# Patient Record
Sex: Female | Born: 1980 | Race: Black or African American | Hispanic: No | Marital: Married | Smoking: Never smoker
Health system: Southern US, Community
[De-identification: ages and names within clinical notes are randomized; demographics above are authoritative.]

## PROBLEM LIST (undated history)

## (undated) DIAGNOSIS — F32A Depression, unspecified: Secondary | ICD-10-CM

## (undated) DIAGNOSIS — F419 Anxiety disorder, unspecified: Secondary | ICD-10-CM

## (undated) DIAGNOSIS — Z789 Other specified health status: Secondary | ICD-10-CM

## (undated) DIAGNOSIS — F329 Major depressive disorder, single episode, unspecified: Secondary | ICD-10-CM

## (undated) HISTORY — PX: NO PAST SURGERIES: SHX2092

## (undated) HISTORY — PX: OTHER SURGICAL HISTORY: SHX169

## (undated) HISTORY — DX: Major depressive disorder, single episode, unspecified: F32.9

## (undated) HISTORY — DX: Anxiety disorder, unspecified: F41.9

## (undated) HISTORY — DX: Depression, unspecified: F32.A

---

## 2005-11-29 ENCOUNTER — Other Ambulatory Visit: Admission: RE | Admit: 2005-11-29 | Discharge: 2005-11-29 | Payer: Self-pay | Admitting: Cardiology

## 2006-05-07 ENCOUNTER — Encounter: Admission: RE | Admit: 2006-05-07 | Discharge: 2006-05-07 | Payer: Self-pay | Admitting: Internal Medicine

## 2006-12-16 ENCOUNTER — Other Ambulatory Visit: Admission: RE | Admit: 2006-12-16 | Discharge: 2006-12-16 | Payer: Self-pay | Admitting: Internal Medicine

## 2007-12-22 ENCOUNTER — Other Ambulatory Visit: Admission: RE | Admit: 2007-12-22 | Discharge: 2007-12-22 | Payer: Self-pay | Admitting: Internal Medicine

## 2008-02-15 IMAGING — US US ABDOMEN COMPLETE
1 series · 14 of 25 positions shown · non-contrast
Comparison: none

CLINICAL DATA: 24 year old female with epigastric abdominal pain.  
COMPLETE ABDOMINAL ULTRASOUND:
TECHNIQUE: Complete abdominal ultrasound examination was performed including evaluation of the liver, gallbladder, bile ducts, pancreas, kidneys, spleen, IVC, and abdominal aorta.

[Series 1: us abdomen complete · 0.34mm/px · 14 of 79 slices shown]
[im 1/79]
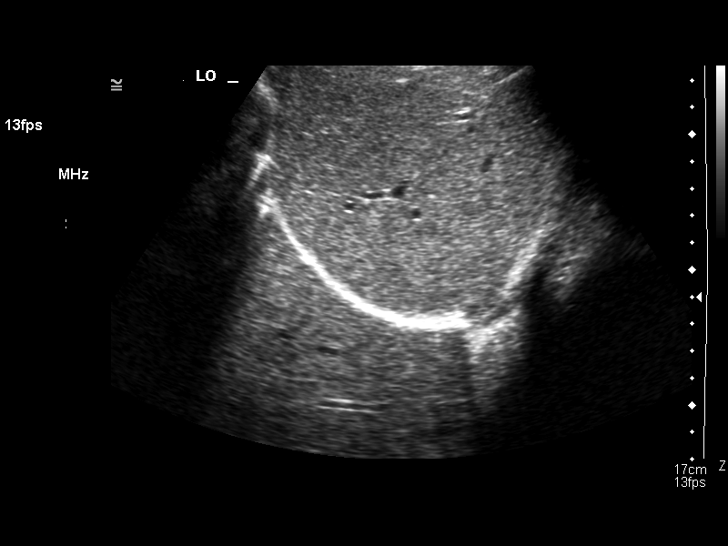
[im 7/79]
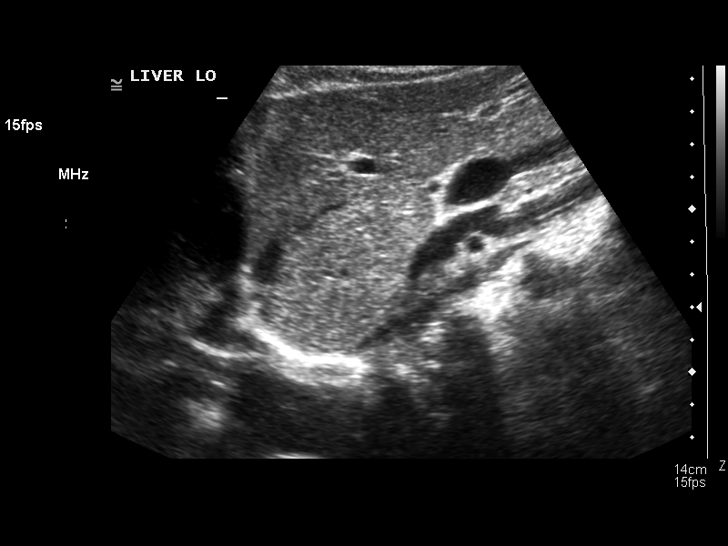
[im 14/79]
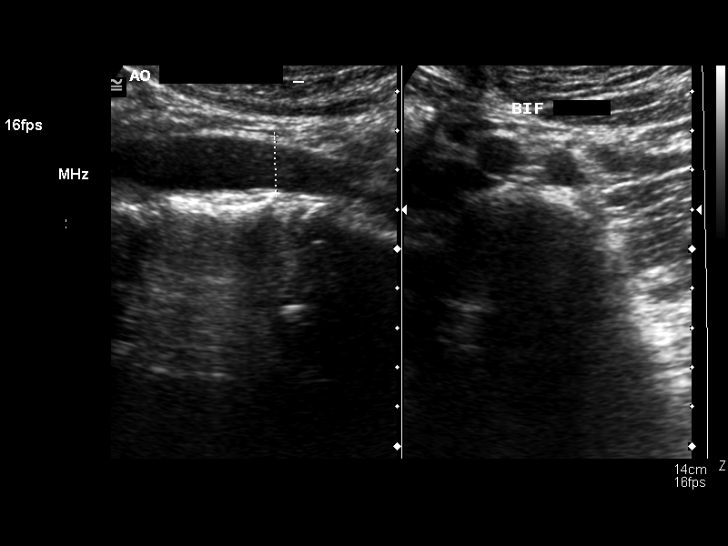
[im 20/79]
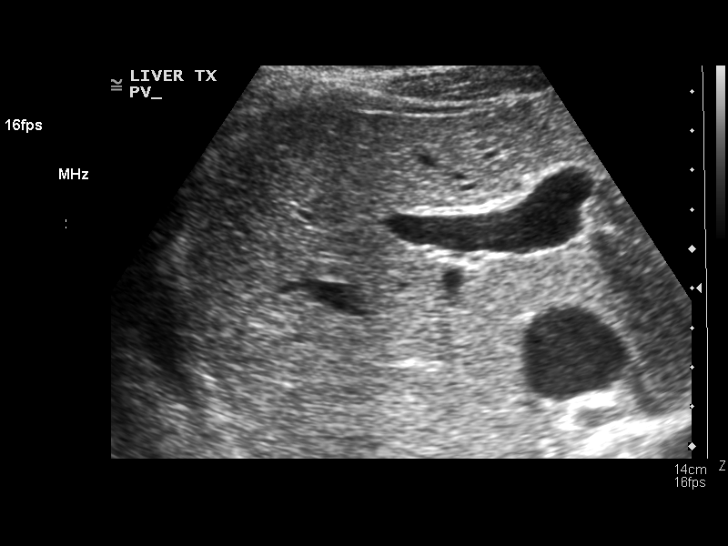
[im 27/79]
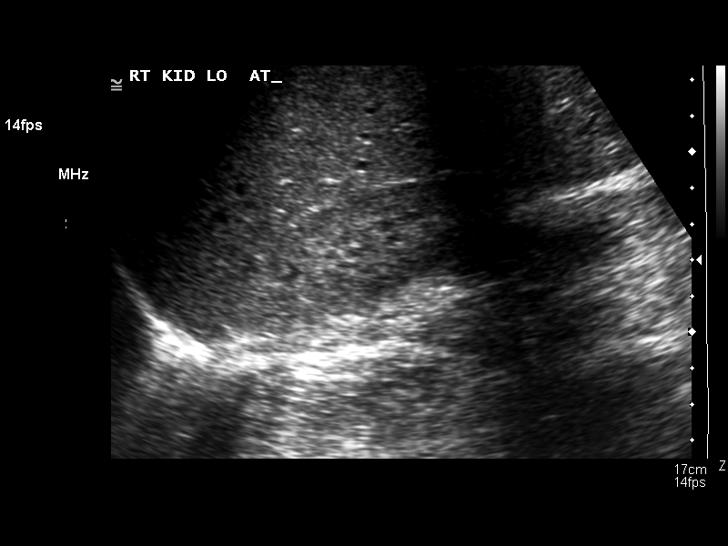
[im 30/79]
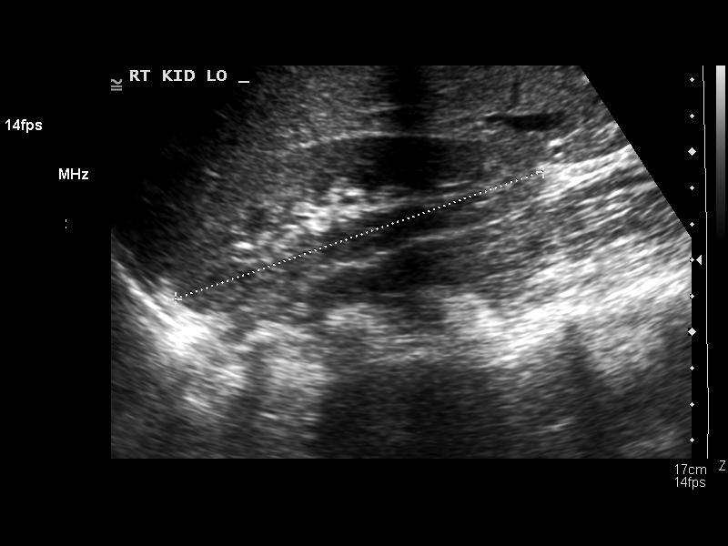
[im 36/79]
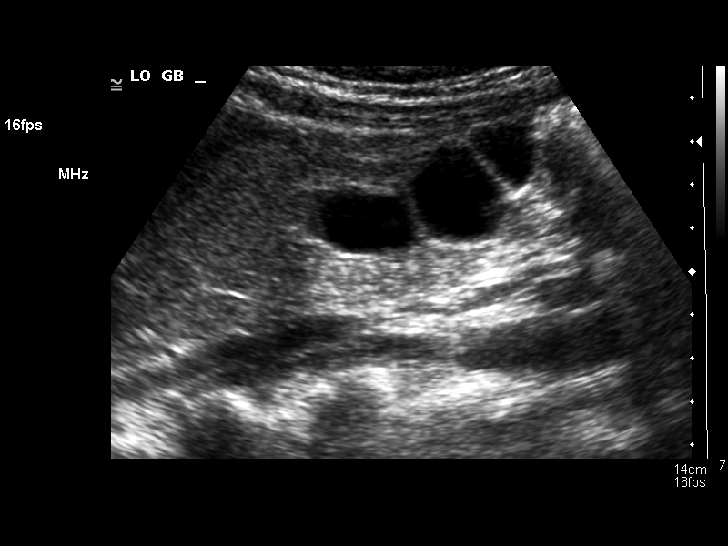
[im 43/79]
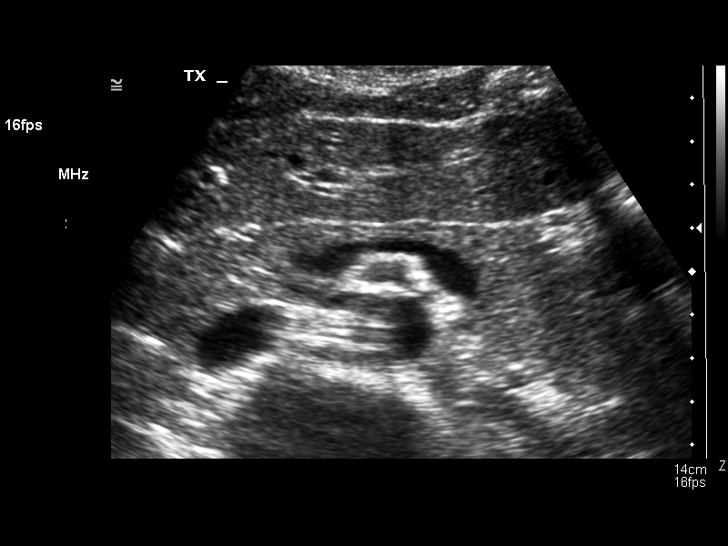
[im 49/79]
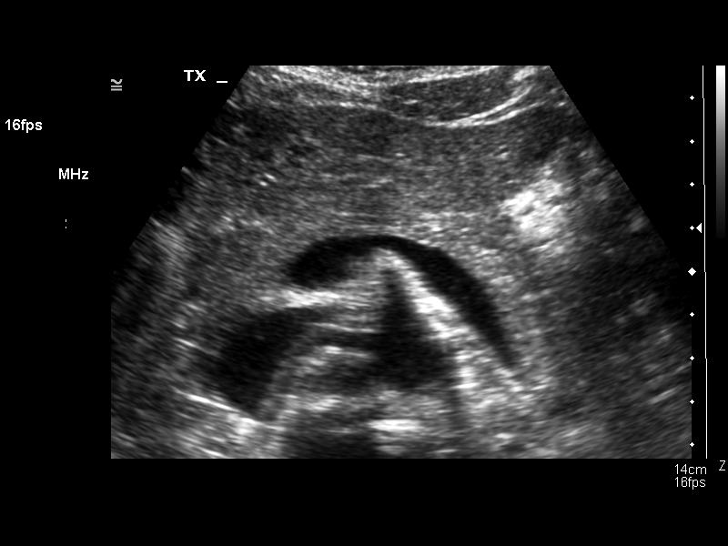
[im 53/79]
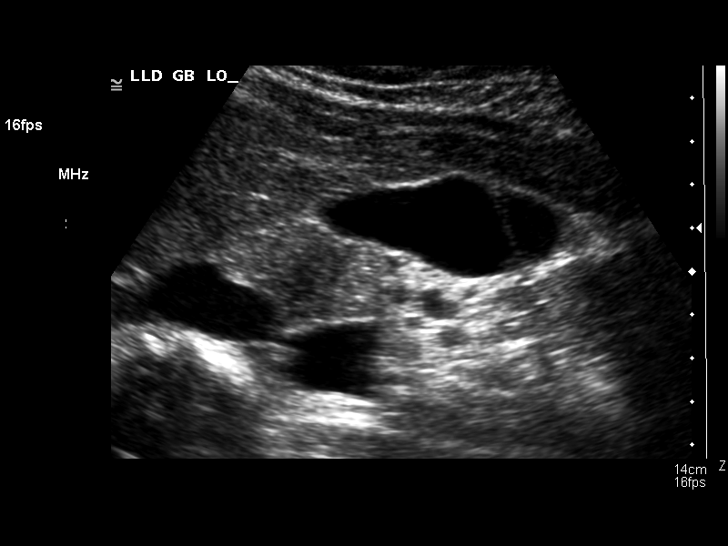
[im 59/79]
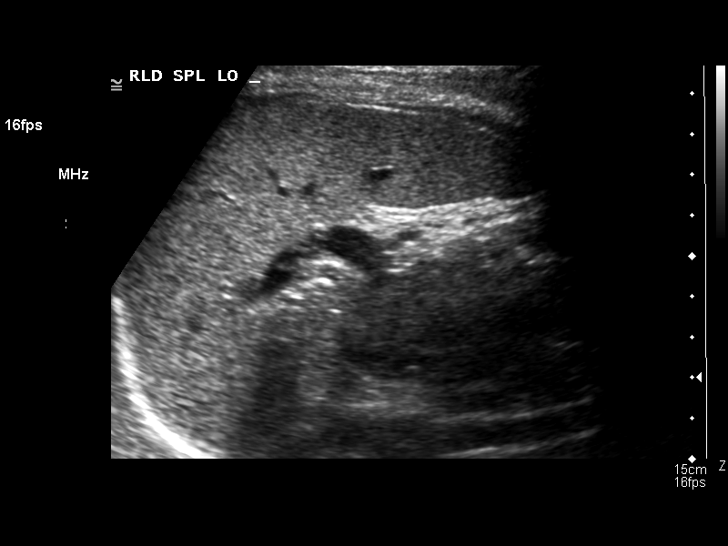
[im 66/79]
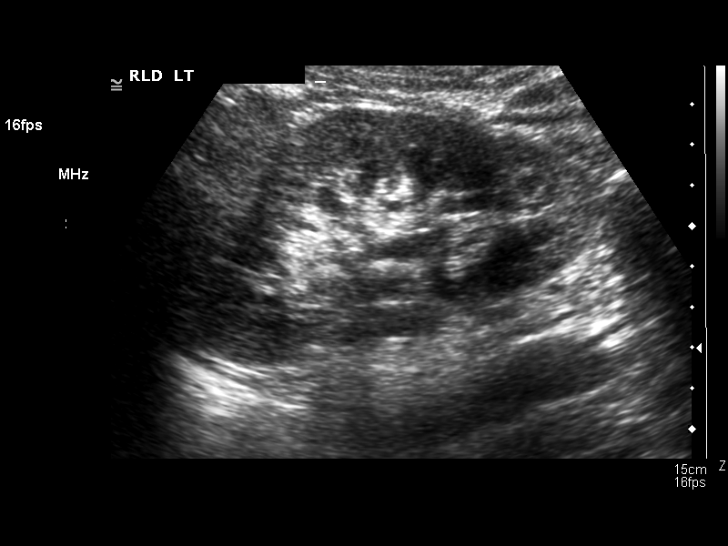
[im 72/79]
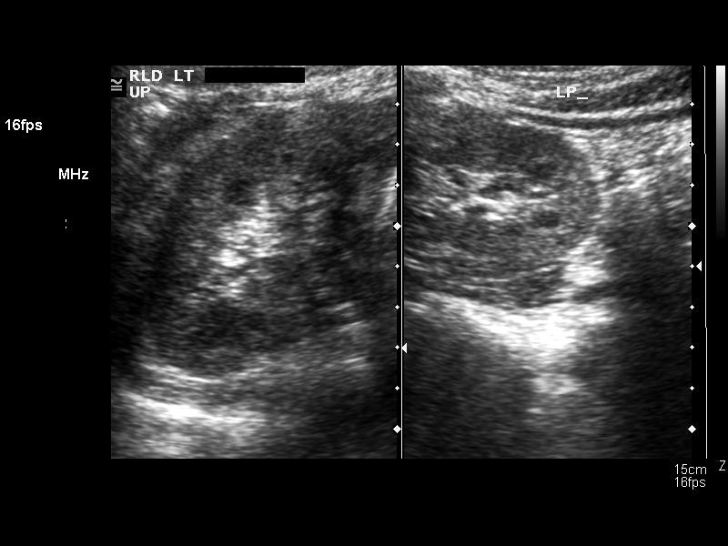
[im 79/79]
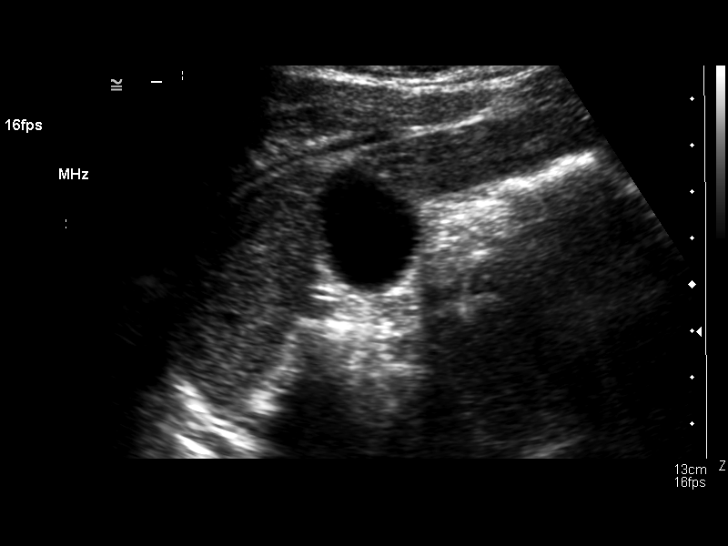

[14 of 25 positions shown; findings below may reference images not displayed]

FINDINGS: There is no evidence of gallstones or biliary ductal dilatation.  The liver is within normal limits in echogenicity, and no focal liver lesions are seen.  The visualized portions of the IVC and pancreas are unremarkable.
There is no evidence of splenomegaly.  The kidneys are unremarkable, and there is no evidence of hydronephrosis.  The abdominal aorta is non-dilated.
Measurements:  Normal gallbladder wall thickness at 1mm.  Common bile duct is non dilated with a diameter of 2mm.  The spleen has a length of 9.7cm.  Both kidneys measure 10.8cm.  The aorta has a maximal diameter of 2cm.  No ascites.
IMPRESSION: Negative abdominal ultrasound.

## 2008-12-30 ENCOUNTER — Other Ambulatory Visit: Admission: RE | Admit: 2008-12-30 | Discharge: 2008-12-30 | Payer: Self-pay | Admitting: Internal Medicine

## 2010-11-26 NOTE — L&D Delivery Note (Signed)
  Delivery Note  Complete dilation at 0803 Onset of pushing at 0803 FHR second stage 145 / cat 1  Anesthesia: none  Delivery of a viable female at [phone] CNM in LOA position.  Nuchal Cord none. Cord double clamped after cessation of pulsation, cut by FOB.  Cord blood sample collected. Collection of cord blood donation - not collected. Arterial cord blood sample not indicated.  Placenta delivered spontaneous by [facility] intact with 3 VC at 0857. Placenta to L&D. Uterine tone firm bleeding small  2nd laceration identified.  1% xylocaine local Repair 3-0 vicyrl for vaginal and interrupted followed by 4-0 subcuticular skin closure Minimal edema to tissue Est. Blood Loss (mL):  Complications: none  Mom to postpartum.  Baby to with Mom.  Cliff Damiani 08/05/2011, 9:25 AM

## 2011-01-08 LAB — HIV ANTIBODY (ROUTINE TESTING W REFLEX): HIV: NONREACTIVE

## 2011-01-08 LAB — RUBELLA ANTIBODY, IGM: Rubella: IMMUNE

## 2011-01-08 LAB — RPR: RPR: NONREACTIVE

## 2011-04-30 ENCOUNTER — Observation Stay (HOSPITAL_COMMUNITY)
Admission: AD | Admit: 2011-04-30 | Discharge: 2011-05-01 | DRG: 886 | Disposition: A | Discharge status: Home / Self Care | Payer: BC Managed Care – PPO | Source: Ambulatory Visit | Attending: Obstetrics & Gynecology | Admitting: Obstetrics & Gynecology

## 2011-04-30 ENCOUNTER — Inpatient Hospital Stay (HOSPITAL_COMMUNITY): Payer: BC Managed Care – PPO

## 2011-04-30 DIAGNOSIS — Z2989 Encounter for other specified prophylactic measures: Secondary | ICD-10-CM

## 2011-04-30 DIAGNOSIS — O99891 Other specified diseases and conditions complicating pregnancy: Principal | ICD-10-CM | POA: Diagnosis present

## 2011-04-30 DIAGNOSIS — O368390 Maternal care for abnormalities of the fetal heart rate or rhythm, unspecified trimester, not applicable or unspecified: Secondary | ICD-10-CM | POA: Diagnosis not present

## 2011-04-30 DIAGNOSIS — Z298 Encounter for other specified prophylactic measures: Secondary | ICD-10-CM

## 2011-04-30 DIAGNOSIS — Y92009 Unspecified place in unspecified non-institutional (private) residence as the place of occurrence of the external cause: Secondary | ICD-10-CM

## 2011-04-30 DIAGNOSIS — W108XXA Fall (on) (from) other stairs and steps, initial encounter: Secondary | ICD-10-CM | POA: Diagnosis present

## 2011-04-30 LAB — KLEIHAUER-BETKE STAIN: Quantitation Fetal Hemoglobin: 5 mL

## 2011-05-02 LAB — RH IMMUNE GLOBULIN WORKUP (NOT WOMEN'S HOSP): ABO/RH(D): A NEG

## 2011-06-01 NOTE — Discharge Summary (Signed)
  NAMEKARLE, DESROSIER NO.:  [id number]  MEDICAL RECORD NO.:  [id number]  LOCATION:  9163                          FACILITY:  [facility]  PHYSICIAN:  Genia Del, M.D.DATE OF BIRTH:  1981/03/14  DATE OF ADMISSION:  04/30/2011 DATE OF DISCHARGE:  05/01/2011                              DISCHARGE SUMMARY   ADMISSION DIAGNOSIS:  A 25 weeks and 1 day gestation intrauterine pregnancy status post fall at home.  The patient admitted for 24 observation.  DISCHARGE DIAGNOSIS:  A 25 weeks and 2 days' gestation, stable post fall with symptoms of abruption.  HISTORY:  The patient is gravida 1, para 0, at 25 weeks and 1 day gestation with [facility] of August 13, 2011.  Prenatal care was obtained at [facility] since 9 weeks with primary Dr. Ernestina Penna.  PRENATAL LABS:  A negative, rubella immune, HIV negative, RPR negative, hepatitis and B negative.  Prenatal course complicated by subchorionic hemorrhage at 14 weeks for which she received a dose of RhoGAM.  MEDICAL AND SURGICAL HISTORY:  PCOS and infertility.  ALLERGIES:  HYDROCODONE.  CURRENT MEDICATIONS:  Prenatal vitamins.  The patient presented to MAU after falling down 5-6 stairs at home on her back.  On admit, the patient had no vaginal bleeding, no leaking of fluid.  Good fetal movement.  Review of systems was negative.  Vital signs stable.  Fetal heart rate was in the 50s with accelerations and no decels.  No contractions on toco.  Admit labs, Kleihauer-Betke showed 0.1% equal to 5 mL of fetal hemoglobin in maternal circulation.  The patient received a dose of RhoGAM and underwent continuous electronic fetal monitoring.  She was observed overnight.  Normal physical exam.  Abdomen was nontender and no rigidity noted.  The next morning, fetal heart rate remained in the 150s with positive accelerations and no contractions. The patient was discharged home with abruption precautions, instructed on fetal movement count  daily.  She is to follow up with [facility] as scheduled in 1 week.   ______________________________ Arlan Organ, CNM   ______________________________ Genia Del, M.D.   DP/MEDQ  D:  05/02/2011  T:  05/03/2011  Job:  [id number]  Electronically Signed by Arlan Organ CNM on 05/06/2011 06:51:27 PM Electronically Signed by Genia Del M.D. on 06/01/2011 09:47:27 PM

## 2011-07-09 LAB — STREP B DNA PROBE: GBS: NEGATIVE

## 2011-08-05 ENCOUNTER — Inpatient Hospital Stay (HOSPITAL_COMMUNITY)
Admission: AD | Admit: 2011-08-05 | Discharge: 2011-08-07 | DRG: 372 | Disposition: A | Discharge status: Home / Self Care | Payer: BC Managed Care – PPO | Source: Ambulatory Visit | Attending: Obstetrics | Admitting: Obstetrics

## 2011-08-05 ENCOUNTER — Encounter (HOSPITAL_COMMUNITY): Payer: Self-pay | Admitting: *Deleted

## 2011-08-05 DIAGNOSIS — O4290 Premature rupture of membranes, unspecified as to length of time between rupture and onset of labor, unspecified weeks of gestation: Secondary | ICD-10-CM | POA: Diagnosis present

## 2011-08-05 DIAGNOSIS — IMO0001 Reserved for inherently not codable concepts without codable children: Secondary | ICD-10-CM | POA: Diagnosis present

## 2011-08-05 DIAGNOSIS — O139 Gestational [pregnancy-induced] hypertension without significant proteinuria, unspecified trimester: Principal | ICD-10-CM | POA: Diagnosis present

## 2011-08-05 HISTORY — DX: Other specified health status: Z78.9

## 2011-08-05 LAB — CBC
HCT: 38.7 % (ref 36.0–46.0)
Hemoglobin: 12.6 g/dL (ref 12.0–15.0)
MCH: 27.9 pg (ref 26.0–34.0)
MCHC: 32.6 g/dL (ref 30.0–36.0)
MCV: 85.6 fL (ref 78.0–100.0)
Platelets: 185 10*3/uL (ref 150–400)
RBC: 4.52 MIL/uL (ref 3.87–5.11)
RDW: 13.6 % (ref 11.5–15.5)
WBC: 10.5 10*3/uL (ref 4.0–10.5)

## 2011-08-05 LAB — COMPREHENSIVE METABOLIC PANEL
ALT: 22 U/L (ref 0–35)
AST: 28 U/L (ref 0–37)
Albumin: 2.9 g/dL — ABNORMAL LOW (ref 3.5–5.2)
Alkaline Phosphatase: 205 U/L — ABNORMAL HIGH (ref 39–117)
BUN: 7 mg/dL (ref 6–23)
CO2: 22 mEq/L (ref 19–32)
Calcium: 9.4 mg/dL (ref 8.4–10.5)
Chloride: 105 mEq/L (ref 96–112)
Creatinine, Ser: 0.53 mg/dL (ref 0.50–1.10)
GFR calc Af Amer: >60 mL/min (ref >60)
GFR calc non Af Amer: >60 mL/min (ref >60)
Glucose, Bld: 84 mg/dL (ref 70–99)
Potassium: 3.8 mEq/L (ref 3.5–5.1)
Sodium: 136 mEq/L (ref 135–145)
Total Bilirubin: 0.5 mg/dL (ref 0.3–1.2)
Total Protein: 6.3 g/dL (ref 6.0–8.3)

## 2011-08-05 LAB — GC/CHLAMYDIA PROBE AMP, GENITAL: Chlamydia: NEGATIVE

## 2011-08-05 LAB — RUBELLA ANTIBODY, IGM: Rubella: IMMUNE

## 2011-08-05 LAB — STREP B DNA PROBE: GBS: NEGATIVE

## 2011-08-05 LAB — HIV ANTIBODY (ROUTINE TESTING W REFLEX): HIV: NONREACTIVE

## 2011-08-05 LAB — LACTATE DEHYDROGENASE: LDH: 217 U/L (ref 94–250)

## 2011-08-05 LAB — URIC ACID: Uric Acid, Serum: 4.7 mg/dL (ref 2.4–7.0)

## 2011-08-05 LAB — RPR: RPR Ser Ql: NONREACTIVE

## 2011-08-05 MED ORDER — FLEET ENEMA 7-19 GM/118ML RE ENEM: 1.0000 | ENEMA | RECTAL | Status: DC | PRN

## 2011-08-05 MED ORDER — LACTATED RINGERS IV SOLN: 500.0000 mL | INTRAVENOUS | Status: DC | PRN

## 2011-08-05 MED ORDER — BENZOCAINE-MENTHOL 20-0.5 % EX AERO
INHALATION_SPRAY | CUTANEOUS | Status: AC
  Administered 2011-08-05: 17:00:00
  Filled 2011-08-05: qty 56

## 2011-08-05 MED ORDER — IBUPROFEN 600 MG PO TABS: 600.0000 mg | ORAL_TABLET | Freq: Four times a day (QID) | ORAL | Status: DC | PRN

## 2011-08-05 MED ORDER — ACETAMINOPHEN 325 MG PO TABS: 650.0000 mg | ORAL_TABLET | ORAL | Status: DC | PRN

## 2011-08-05 MED ORDER — LIDOCAINE HCL (PF) 1 % IJ SOLN
30.0000 mL | INTRAMUSCULAR | Status: DC | PRN
  Filled 2011-08-05 (×2): qty 30

## 2011-08-05 MED ORDER — LACTATED RINGERS IV SOLN
500.0000 mL | INTRAVENOUS | Status: DC | PRN
  Administered 2011-08-05: 1000 mL via INTRAVENOUS

## 2011-08-05 MED ORDER — NALBUPHINE HCL 10 MG/ML IJ SOLN: 10.0000 mg | INTRAMUSCULAR | Status: DC | PRN

## 2011-08-05 MED ORDER — TETANUS-DIPHTH-ACELL PERTUSSIS 5-2.5-18.5 LF-MCG/0.5 IM SUSP
0.5000 mL | Freq: Once | INTRAMUSCULAR | Status: AC
  Administered 2011-08-06: 0.5 mL via INTRAMUSCULAR
  Filled 2011-08-05: qty 0.5

## 2011-08-05 MED ORDER — IBUPROFEN 600 MG PO TABS
600.0000 mg | ORAL_TABLET | Freq: Four times a day (QID) | ORAL | Status: DC | PRN
  Administered 2011-08-05 – 2011-08-07 (×7): 600 mg via ORAL
  Filled 2011-08-05 (×7): qty 1

## 2011-08-05 MED ORDER — OXYTOCIN BOLUS FROM INFUSION
500.0000 mL | Freq: Once | INTRAVENOUS | Status: DC
  Filled 2011-08-05: qty 500

## 2011-08-05 MED ORDER — LACTATED RINGERS IV SOLN
INTRAVENOUS | Status: DC
  Administered 2011-08-05: 02:00:00 via INTRAVENOUS

## 2011-08-05 MED ORDER — OXYTOCIN 20 UNITS IN LACTATED RINGERS INFUSION - SIMPLE
1.0000 m[IU]/min | INTRAVENOUS | Status: DC
  Administered 2011-08-05: 4 m[IU]/min via INTRAVENOUS
  Administered 2011-08-05: 999 m[IU]/min via INTRAVENOUS
  Administered 2011-08-05: 2 m[IU]/min via INTRAVENOUS
  Filled 2011-08-05: qty 1000

## 2011-08-05 MED ORDER — SIMETHICONE 80 MG PO CHEW: 80.0000 mg | CHEWABLE_TABLET | ORAL | Status: DC | PRN

## 2011-08-05 MED ORDER — SENNOSIDES-DOCUSATE SODIUM 8.6-50 MG PO TABS
2.0000 | ORAL_TABLET | Freq: Every day | ORAL | Status: DC
  Administered 2011-08-05 – 2011-08-06 (×2): 2 via ORAL

## 2011-08-05 MED ORDER — CITRIC ACID-SODIUM CITRATE 334-500 MG/5ML PO SOLN: 30.0000 mL | ORAL | Status: DC | PRN

## 2011-08-05 MED ORDER — ONDANSETRON HCL 4 MG/2ML IJ SOLN: 4.0000 mg | Freq: Four times a day (QID) | INTRAMUSCULAR | Status: DC | PRN

## 2011-08-05 MED ORDER — IBUPROFEN 800 MG PO TABS
800.0000 mg | ORAL_TABLET | Freq: Once | ORAL | Status: DC
  Filled 2011-08-05: qty 1

## 2011-08-05 MED ORDER — LIDOCAINE HCL 2 % EX GEL
Freq: Once | CUTANEOUS | Status: DC
  Filled 2011-08-05: qty 5

## 2011-08-05 MED ORDER — LIDOCAINE HCL (PF) 1 % IJ SOLN: 30.0000 mL | INTRAMUSCULAR | Status: DC | PRN

## 2011-08-05 MED ORDER — TERBUTALINE SULFATE 1 MG/ML IJ SOLN: 0.2500 mg | Freq: Once | INTRAMUSCULAR | Status: DC | PRN

## 2011-08-05 MED ORDER — NALBUPHINE SYRINGE 5 MG/0.5 ML
10.0000 mg | INJECTION | Freq: Once | INTRAMUSCULAR | Status: AC
  Administered 2011-08-05: 10 mg via INTRAVENOUS
  Filled 2011-08-05: qty 1

## 2011-08-05 MED ORDER — OXYTOCIN 20 UNITS IN LACTATED RINGERS INFUSION - SIMPLE: 125.0000 mL/h | Freq: Once | INTRAVENOUS | Status: DC

## 2011-08-05 MED ORDER — TRAMADOL-ACETAMINOPHEN 37.5-325 MG PO TABS
1.0000 | ORAL_TABLET | Freq: Four times a day (QID) | ORAL | Status: DC | PRN
  Administered 2011-08-05: 1 via ORAL
  Filled 2011-08-05: qty 1

## 2011-08-05 MED ORDER — IBUPROFEN 800 MG PO TABS
800.0000 mg | ORAL_TABLET | Freq: Three times a day (TID) | ORAL | Status: DC
  Administered 2011-08-05: 800 mg via ORAL
  Filled 2011-08-05: qty 1

## 2011-08-05 MED ORDER — BENZOCAINE-MENTHOL 20-0.5 % EX AERO: 1.0000 "application " | INHALATION_SPRAY | CUTANEOUS | Status: DC | PRN

## 2011-08-05 MED ORDER — LANOLIN HYDROUS EX OINT: TOPICAL_OINTMENT | CUTANEOUS | Status: DC | PRN

## 2011-08-05 MED ORDER — FAMOTIDINE 20 MG PO TABS
20.0000 mg | ORAL_TABLET | Freq: Every day | ORAL | Status: DC
  Administered 2011-08-06 – 2011-08-07 (×2): 20 mg via ORAL
  Filled 2011-08-05 (×2): qty 1

## 2011-08-05 MED ORDER — PRENATAL PLUS 27-1 MG PO TABS
1.0000 | ORAL_TABLET | Freq: Every day | ORAL | Status: DC
  Administered 2011-08-06 – 2011-08-07 (×2): 1 via ORAL
  Filled 2011-08-05 (×2): qty 1

## 2011-08-05 NOTE — Progress Notes (Signed)
1115 Pt transferred to 118 per w/c with baby

## 2011-08-05 NOTE — Progress Notes (Signed)
New order received for Nubain 10mg  IV once by Marlinda Mike, CNM

## 2011-08-05 NOTE — Progress Notes (Signed)
G1 at 38.1wks. Leaking watery, pinkish fld since 1800. Mild cramping

## 2011-08-05 NOTE — H&P (Signed)
  Lauren Mcdonald is a 30 y.o. female presenting for SROM since 1700 on 08/04/2011 - few mild ctx but not actively laboring. PNCare at [facility] since 9 wks with Dr Ernestina Penna as primary OB. Elevated blood pressure today - first trimester BP 130/80 OB History    Grav Para Term Preterm Abortions TAB SAB Ect Mult Living   1              Past Medical History  Diagnosis Date  . No pertinent past medical history    Past Surgical History  Procedure Date  . No past surgeries    Family History: family history is not on file. Social History:  reports that she has never smoked. She does not have any smokeless tobacco history on file. She reports that she does not drink alcohol or use illicit drugs.  Review of Systems SROM at 1700 - clear fluid with pink pinged color after midnight. Few mild ctx - not painful at all. Planning epidural in active labor.  + FM. No bleeding. No headache, vision changes, or epigastric pain. No hx hypertension in past or with antepartum visits.  Physical Exam Alert and oriented, mildly anxious about admission Heart: RRR Lungs: unlabored / no SOB / clear Abdomen: soft / gravid at term Extremities: trace edema / no calf pain  Cervix: ( not re-examined - just examined by nurse prior to starting pitocin) Dilation: 2 Effacement (%): 50 Station: -2 Exam by:: A.Davis, RN  Vital Signs: Blood pressure 134/95, pulse 70, temperature 98.1 F (36.7 C), temperature source Oral, resp. rate 17, height 5\' 2"  (1.575 m), weight 87.635 kg (193 lb 3.2 oz), last menstrual period 11/04/2010.   Admission Labs:  08/05/2011 03:16  CBC 08/05/2011 01:56  WBC 10.5  RBC 4.52  HGB 12.6  HCT 38.7  MCV 85.6  MCH 27.9  MCHC 32.6  RDW 13.6  Platelets 185   PIH labs reviewed: Creatinine 0.53, Uric acid 4.7, SGOT 28, SGPT22   Prenatal labs: ABO, Rh: --/--/A NEG (06/04 2338) Antibody: POS (06/04 2338) Rubella:  Immune RPR: Nonreactive (09/09 0000)  HBsAg: Negative (09/09 0000)    HIV: Non-reactive (09/09 0000)  GBS: Negative (09/09 0000)  1 hr Glucola - NL  Anatomy US - NL   Assessment/Plan: 38 weeks SROM - premature prior to onset of labor Negative GBS Elevation of BP - consistent with gestational hypertension  No evidence of pre-eclampsia at this time  1) Admit 2) Start low dose pitocin - labor augmentation ( ROM x 7 hours)  BAILEY,TANYA 08/05/2011, 3:10 AM

## 2011-08-05 NOTE — Progress Notes (Signed)
Nsy called to room Infant due to infant being cold and jettery

## 2011-08-05 NOTE — Progress Notes (Signed)
5621 spontaneous vaginal delivery of viable female apgars 8 and 9 see delivery record

## 2011-08-05 NOTE — H&P (Signed)
Agree with note and plan.

## 2011-08-05 NOTE — Progress Notes (Signed)
Wiliam Ke CNM and Sela Hua CNM at bedside

## 2011-08-05 NOTE — Progress Notes (Signed)
  S: Feeling urge to push      Tolerating contractions well- received one dose nubain 10 mg - planned epidural but decided not to proceed with epidural due to rapid labor progression and urge to push   O:  VS: Blood pressure 151/80, pulse 65, temperature 97.4 F (36.3 C), temperature source Oral, resp. rate 20, height 5\' 2"  (1.575 m), weight 87.635 kg (193 lb 3.2 oz), last menstrual period 11/04/2010.        FHR : baseline 145 / variability moderaet / accels 10x10 / decels few rare variables to 110        EFM: Category 1        Toco: contractions every 2 minutes / pitocin at 8 mu/min - decreased to 4 mu/min with complete dilation due to intense pain with ctx and decision not to get epidural        Cervix : 10 cm / 100 % / vtx / +        Membranes: clear with show  A: active labor     Complete dilation with urge to push  P: begin active second stage     Anticipate SVB     BAILEY,TANYA 08/05/2011, 8:16 AM

## 2011-08-06 LAB — CBC
HCT: 32 % — ABNORMAL LOW (ref 36.0–46.0)
Hemoglobin: 10.6 g/dL — ABNORMAL LOW (ref 12.0–15.0)
MCH: 28.2 pg (ref 26.0–34.0)
MCHC: 33.1 g/dL (ref 30.0–36.0)
MCV: 85.1 fL (ref 78.0–100.0)
Platelets: 162 10*3/uL (ref 150–400)
RBC: 3.76 MIL/uL — ABNORMAL LOW (ref 3.87–5.11)
RDW: 13.5 % (ref 11.5–15.5)
WBC: 13.1 10*3/uL — ABNORMAL HIGH (ref 4.0–10.5)

## 2011-08-06 NOTE — Progress Notes (Signed)
  Post Partum Day #1            Subjective:   Pt reports feeling well, (+) sacral/ coccyx pain / Tolerating po/ Voiding without problems/ No n/v/ Bleeding is light/ Pain controlled withprescription NSAID's including Motrin and tramadol  Newborn info:  Information for the patient's newborn:  Josphine, Laffey [[id number]]  female feeding breast          Objective:     VS: Blood pressure 117/75, pulse 68, temperature 98.7 F (37.1 C), temperature source Oral, resp. rate 18, height 5\' 2"  (1.575 m), weight 87.635 kg (193 lb 3.2 oz), last menstrual period 11/04/2010, currently breastfeeding.   Intake/Output Summary (Last 24 hours) at 08/06/11 0859 Last data filed at 08/05/11 0865  Gross per 24 hour  Intake      0 ml  Output    300 ml  Net   -300 ml        Basename 08/06/11 0515 08/05/11 0156  WBC 13.1* 10.5  HGB 10.6* 12.6  HCT 32.0* 38.7  PLT 162 185     Blood type: A NEG (09/10 0515) / Infant Rh positive  Rubella: Immune (09/09 0000)     Physical Exam:   A & O x 3 NAD   Lungs: CTAB  Heart: RR  Abdomen: soft, non-tender, FF @ U+2 (full bladder)  Perineum: repair intact, edema none  Lochia: small   Extremities: neg Homans', edema trace    Assessment/Plan:  PPD # 1 / 30 y.o., G1P1001 S/P:spontaneous vaginal Active Problems:  Postpartum care following vaginal delivery (9/9) Gest. GTN at admit with nl PIH labs Rh neg mom with Rh pos infant  Rhogam prior to d/c    normal postpartum exam, No evidence of PEC  Doing well  Continue routine post partum orders  anticipate D/C in AM         LOS: 1 day   Malikye Reppond, CNM, MSN 08/06/2011, 8:59 AM

## 2011-08-07 LAB — RH IG WORKUP (INCLUDES ABO/RH)

## 2011-08-07 MED ORDER — INFLUENZA VIRUS VACC SPLIT PF IM SUSP
0.5000 mL | Freq: Once | INTRAMUSCULAR | Status: AC
  Administered 2011-08-07: 0.5 mL via INTRAMUSCULAR
  Filled 2011-08-07: qty 0.5

## 2011-08-07 MED ORDER — IBUPROFEN 600 MG PO TABS: 600.0000 mg | ORAL_TABLET | Freq: Four times a day (QID) | ORAL | Status: AC | PRN

## 2011-08-07 NOTE — Discharge Summary (Signed)
Obstetric Discharge Summary Reason for Admission: rupture of membranes Prenatal Procedures: ultrasound Intrapartum Procedures: spontaneous vaginal delivery Postpartum Procedures: Rho(D) Ig Complications-Operative and Postpartum: 2nd degree perineal laceration Hemoglobin  Date Value Range Status  08/06/2011 10.6* 12.0-15.0 (g/dL) Final     HCT  Date Value Range Status  08/06/2011 32.0* 36.0-46.0 (%) Final    Discharge Diagnoses: Term Pregnancy-delivered  Discharge Information: Date: 08/07/2011 Activity: pelvic rest Diet: routine Medications: PNV and Ibuprophen Condition: stable Instructions: refer to practice specific booklet Discharge to: home Follow-up Information    Follow up with [facility] in 6 weeks.   Contact information:   [address] [place] [address] [phone]          Newborn Data: Live born female  Birth Weight: 6 lb 8.6 oz (2965 g) APGAR: 8, 10  Home with mother.  [name] 08/07/2011, 8:26 AM

## 2011-08-07 NOTE — Consult Note (Signed)
Mom's breasts filling with hard, warm areas.  Reviewed differences between full breasts, engorgement and mastitis and treatment for each.  Lots of teaching done.  Nipples sore, but not broken area noted.  Comfort gels given for soreness with instructions

## 2011-08-07 NOTE — Progress Notes (Signed)
Post Partum Day #2           Information for the patient's newborn:  Keviana, Guida [[id number]]  female    Subjective: No HA, SOB, CP, F/C, breast symptoms. Coccyx Pain improved. Normal vaginal bleeding, no clots.  Rhogam given.  Feeding: breast  Objective: BP 128/83  Pulse 70  Temp(Src) 98.3 F (36.8 C) (Oral)  Resp 20  Ht 5\' 2"  (1.575 m)  Wt 87.635 kg (193 lb 3.2 oz)  BMI 35.34 kg/m2  LMP 11/04/2010  Breastfeeding? Unknown  No intake or output data in the 24 hours ending 08/07/11 0821     Basename 08/06/11 0515 08/05/11 0156  WBC 13.1* 10.5  HGB 10.6* 12.6  HCT 32.0* 38.7  PLT 162 185    Blood type: --/--/A NEG (09/10 0515) Rubella: Immune (09/09 0000)    Physical Exam:  General: alert, cooperative and no distress Uterine Fundus: firm Lochia: appropriate Perineum: repair intact, edema none DVT Evaluation: Negative Homan's sign. No significant calf/ankle edema.    Assessment/Plan: PPD # 2 / 30 y.o., G1P1001 S/P:spontaneous vaginal Principal Problem:  *Postpartum care following vaginal delivery (9/9)   normal postpartum exam  Continue current postpartum care  D/C home   LOS: 2 days   PAUL,DANIELA, CNM, MSN 08/07/2011, 8:21 AM

## 2011-12-22 ENCOUNTER — Ambulatory Visit (INDEPENDENT_AMBULATORY_CARE_PROVIDER_SITE_OTHER): Payer: BC Managed Care – PPO

## 2011-12-22 DIAGNOSIS — T2111XA Burn of first degree of chest wall, initial encounter: Secondary | ICD-10-CM

## 2011-12-22 DIAGNOSIS — M25569 Pain in unspecified knee: Secondary | ICD-10-CM

## 2011-12-26 ENCOUNTER — Encounter (HOSPITAL_BASED_OUTPATIENT_CLINIC_OR_DEPARTMENT_OTHER): Payer: Self-pay | Admitting: *Deleted

## 2011-12-26 NOTE — Progress Notes (Signed)
Instructed her to stop taking advil and take tylenol for pain between  now and her surgery.

## 2011-12-28 ENCOUNTER — Ambulatory Visit (HOSPITAL_BASED_OUTPATIENT_CLINIC_OR_DEPARTMENT_OTHER): Payer: BC Managed Care – PPO | Admitting: *Deleted

## 2011-12-28 ENCOUNTER — Encounter (HOSPITAL_BASED_OUTPATIENT_CLINIC_OR_DEPARTMENT_OTHER)
Admission: RE | Disposition: A | Discharge status: Home / Self Care | Payer: Self-pay | Source: Ambulatory Visit | Attending: Orthopedic Surgery

## 2011-12-28 ENCOUNTER — Encounter (HOSPITAL_BASED_OUTPATIENT_CLINIC_OR_DEPARTMENT_OTHER): Payer: Self-pay | Admitting: *Deleted

## 2011-12-28 ENCOUNTER — Encounter (HOSPITAL_BASED_OUTPATIENT_CLINIC_OR_DEPARTMENT_OTHER): Payer: Self-pay | Admitting: Anesthesiology

## 2011-12-28 ENCOUNTER — Ambulatory Visit (HOSPITAL_BASED_OUTPATIENT_CLINIC_OR_DEPARTMENT_OTHER)
Admission: RE | Admit: 2011-12-28 | Discharge: 2011-12-28 | Disposition: A | Discharge status: Home / Self Care | Payer: BC Managed Care – PPO | Source: Ambulatory Visit | Attending: Orthopedic Surgery | Admitting: Orthopedic Surgery

## 2011-12-28 DIAGNOSIS — M2240 Chondromalacia patellae, unspecified knee: Secondary | ICD-10-CM | POA: Insufficient documentation

## 2011-12-28 DIAGNOSIS — M659 Unspecified synovitis and tenosynovitis, unspecified site: Secondary | ICD-10-CM | POA: Insufficient documentation

## 2011-12-28 HISTORY — PX: KNEE ARTHROSCOPY: SHX127

## 2011-12-28 LAB — POCT HEMOGLOBIN-HEMACUE: Hemoglobin: 11.6 g/dL — ABNORMAL LOW (ref 12.0–15.0)

## 2011-12-28 SURGERY — ARTHROSCOPY, KNEE
Anesthesia: General | Site: Knee | Laterality: Left | Wound class: Clean

## 2011-12-28 MED ORDER — BUPIVACAINE HCL (PF) 0.25 % IJ SOLN
INTRAMUSCULAR | Status: DC | PRN
  Administered 2011-12-28: 20 mL

## 2011-12-28 MED ORDER — PROPOFOL 10 MG/ML IV EMUL
INTRAVENOUS | Status: DC | PRN
  Administered 2011-12-28: 250 mg via INTRAVENOUS

## 2011-12-28 MED ORDER — ONDANSETRON HCL 4 MG/2ML IJ SOLN
INTRAMUSCULAR | Status: DC | PRN
  Administered 2011-12-28: 4 mg via INTRAVENOUS

## 2011-12-28 MED ORDER — SODIUM CHLORIDE 0.9 % IR SOLN
Status: DC | PRN
  Administered 2011-12-28: 3000 mL

## 2011-12-28 MED ORDER — PROMETHAZINE HCL 25 MG/ML IJ SOLN: 6.2500 mg | INTRAMUSCULAR | Status: DC | PRN

## 2011-12-28 MED ORDER — KETOROLAC TROMETHAMINE 30 MG/ML IJ SOLN
INTRAMUSCULAR | Status: DC | PRN
  Administered 2011-12-28: 30 mg via INTRAVENOUS

## 2011-12-28 MED ORDER — PROPOFOL 10 MG/ML IV EMUL
INTRAVENOUS | Status: DC | PRN
  Administered 2011-12-28: 100 ug/kg/min via INTRAVENOUS

## 2011-12-28 MED ORDER — EPINEPHRINE HCL 1 MG/ML IJ SOLN
INTRAMUSCULAR | Status: DC | PRN
  Administered 2011-12-28: 1 mg

## 2011-12-28 MED ORDER — LACTATED RINGERS IV SOLN
INTRAVENOUS | Status: DC
  Administered 2011-12-28 (×2): via INTRAVENOUS

## 2011-12-28 MED ORDER — CEFAZOLIN SODIUM 1-5 GM-% IV SOLN
INTRAVENOUS | Status: DC | PRN
  Administered 2011-12-28: 1 g via INTRAVENOUS

## 2011-12-28 MED ORDER — ACETAMINOPHEN 10 MG/ML IV SOLN
1000.0000 mg | Freq: Once | INTRAVENOUS | Status: AC
  Administered 2011-12-28: 1000 mg via INTRAVENOUS

## 2011-12-28 MED ORDER — OXYCODONE-ACETAMINOPHEN 5-325 MG PO TABS: 1.0000 | ORAL_TABLET | Freq: Four times a day (QID) | ORAL | Status: AC | PRN

## 2011-12-28 MED ORDER — DEXAMETHASONE SODIUM PHOSPHATE 4 MG/ML IJ SOLN
INTRAMUSCULAR | Status: DC | PRN
  Administered 2011-12-28: 8 mg via INTRAVENOUS

## 2011-12-28 MED ORDER — FENTANYL CITRATE 0.05 MG/ML IJ SOLN
25.0000 ug | INTRAMUSCULAR | Status: DC | PRN
  Administered 2011-12-28 (×2): 25 ug via INTRAVENOUS

## 2011-12-28 SURGICAL SUPPLY — 41 items
BANDAGE ELASTIC 6 VELCRO ST LF (GAUZE/BANDAGES/DRESSINGS) ×2 IMPLANT
BLADE 4.2CUDA (BLADE) IMPLANT
BLADE GREAT WHITE 4.2 (BLADE) ×2 IMPLANT
CANISTER OMNI JUG 16 LITER (MISCELLANEOUS) ×1 IMPLANT
CANISTER SUCTION 2500CC (MISCELLANEOUS) IMPLANT
CLOTH BEACON ORANGE TIMEOUT ST (SAFETY) ×2 IMPLANT
CUTTER MENISCUS  4.2MM (BLADE)
CUTTER MENISCUS 4.2MM (BLADE) IMPLANT
DRAPE ARTHROSCOPY W/POUCH 114 (DRAPES) ×2 IMPLANT
DRSG EMULSION OIL 3X3 NADH (GAUZE/BANDAGES/DRESSINGS) ×2 IMPLANT
DURAPREP 26ML APPLICATOR (WOUND CARE) ×2 IMPLANT
ELECT MENISCUS 165MM 90D (ELECTRODE) IMPLANT
ELECT REM PT RETURN 9FT ADLT (ELECTROSURGICAL)
ELECTRODE REM PT RTRN 9FT ADLT (ELECTROSURGICAL) IMPLANT
GAUZE SPONGE 4X4 12PLY STRL LF (GAUZE/BANDAGES/DRESSINGS) ×1 IMPLANT
GLOVE BIO SURGEON STRL SZ 6.5 (GLOVE) ×1 IMPLANT
GLOVE BIOGEL PI IND STRL 8 (GLOVE) ×2 IMPLANT
GLOVE BIOGEL PI INDICATOR 8 (GLOVE) ×2
GLOVE ECLIPSE 7.5 STRL STRAW (GLOVE) ×4 IMPLANT
GLOVE INDICATOR 7.0 STRL GRN (GLOVE) ×1 IMPLANT
GOWN BRE IMP PREV XXLGXLNG (GOWN DISPOSABLE) ×2 IMPLANT
GOWN PREVENTION PLUS XLARGE (GOWN DISPOSABLE) ×2 IMPLANT
GOWN PREVENTION PLUS XXLARGE (GOWN DISPOSABLE) ×2 IMPLANT
HOLDER KNEE FOAM BLUE (MISCELLANEOUS) ×2 IMPLANT
KNEE WRAP E Z 3 GEL PACK (MISCELLANEOUS) ×2 IMPLANT
NDL SAFETY ECLIPSE 18X1.5 (NEEDLE) IMPLANT
NEEDLE HYPO 18GX1.5 SHARP (NEEDLE)
PACK ARTHROSCOPY DSU (CUSTOM PROCEDURE TRAY) ×2 IMPLANT
PACK BASIN DAY SURGERY FS (CUSTOM PROCEDURE TRAY) ×2 IMPLANT
PAD CAST 4YDX4 CTTN HI CHSV (CAST SUPPLIES) ×1 IMPLANT
PADDING CAST COTTON 4X4 STRL (CAST SUPPLIES) ×2
PADDING WEBRIL 4 STERILE (GAUZE/BANDAGES/DRESSINGS) ×1 IMPLANT
PENCIL BUTTON HOLSTER BLD 10FT (ELECTRODE) IMPLANT
SET ARTHROSCOPY TUBING (MISCELLANEOUS) ×2
SET ARTHROSCOPY TUBING LN (MISCELLANEOUS) ×1 IMPLANT
SPONGE GAUZE 4X4 12PLY (GAUZE/BANDAGES/DRESSINGS) ×2 IMPLANT
SUT ETHILON 4 0 PS 2 18 (SUTURE) IMPLANT
SYR 5ML LL (SYRINGE) ×2 IMPLANT
TOWEL OR 17X24 6PK STRL BLUE (TOWEL DISPOSABLE) ×2 IMPLANT
TOWEL OR NON WOVEN STRL DISP B (DISPOSABLE) ×2 IMPLANT
WATER STERILE IRR 1000ML POUR (IV SOLUTION) ×2 IMPLANT

## 2011-12-28 NOTE — Brief Op Note (Signed)
12/28/2011  11:27 AM  PATIENT:  Nicholes Stairs  31 y.o. female  PRE-OPERATIVE DIAGNOSIS:  medial meniscus tear left knee  POST-OPERATIVE DIAGNOSIS:  medial meniscus tear left knee  PROCEDURE:  Procedure(s): ARTHROSCOPY KNEE  SURGEON:  Surgeon(s): Harvie Junior, MD  PHYSICIAN ASSISTANT:   ASSISTANTS: bethune   ANESTHESIA:   general  EBL:  Total I/O In: 1000 [I.V.:1000] Out: -   BLOOD ADMINISTERED:none  DRAINS: none   LOCAL MEDICATIONS USED:  NONE  SPECIMEN:  No Specimen  DISPOSITION OF SPECIMEN:  N/A  COUNTS:  YES  TOURNIQUET:  * No tourniquets in log *  DICTATION: .Other Dictation: Dictation Number [id number]  PLAN OF CARE: Discharge to home after PACU  PATIENT DISPOSITION:  PACU - hemodynamically stable.   Delay start of Pharmacological VTE agent (>24hrs) due to surgical blood loss or risk of bleeding:  {YES/NO/NOT APPLICABLE:20182

## 2011-12-28 NOTE — Anesthesia Postprocedure Evaluation (Signed)
  Anesthesia Post-op Note  Patient: Lauren Mcdonald  Procedure(s) Performed:  ARTHROSCOPY KNEE - chondroplasty, medial femoral chondial, medial plica excision  Patient Location: PACU  Anesthesia Type: General  Level of Consciousness: awake, alert  and oriented  Airway and Oxygen Therapy: Patient Spontanous Breathing  Post-op Pain: none  Post-op Assessment: Post-op Vital signs reviewed, Patient's Cardiovascular Status Stable, Respiratory Function Stable, Patent Airway, No signs of Nausea or vomiting and Pain level controlled  Post-op Vital Signs: Reviewed and stable  Complications: No apparent anesthesia complications, patient understands to discard breast milk while taking narcotic pain meds

## 2011-12-28 NOTE — H&P (Signed)
PREOPERATIVE H&P  Chief Complaint: l. kneee pain  HPI: Lauren Mcdonald is a 31 y.o. female who presents for evaluation of l.knee pain with locked knee. It has been present for 6 days and has been worsening. She has failed conservative measures. Pain is rated as moderate.  Past Medical History  Diagnosis Date  . No pertinent past medical history    Past Surgical History  Procedure Date  . No past surgeries   . Wisdom teeth extracted    History   Social History  . Marital Status: Married    Spouse Name: N/A    Number of Children: N/A  . Years of Education: N/A   Social History Main Topics  . Smoking status: Never Smoker   . Smokeless tobacco: None  . Alcohol Use: No  . Drug Use: No  . Sexually Active: Yes   Other Topics Concern  . None   Social History Narrative  . None   History reviewed. No pertinent family history. Allergies  Allergen Reactions  . Hydrocodone Itching   Prior to Admission medications   Medication Sig Start Date End Date Taking? Authorizing Provider  ibuprofen (ADVIL,MOTRIN) 200 MG tablet Take 200 mg by mouth every 6 (six) hours as needed.   Yes Historical Provider, MD  prenatal vitamin w/FE, FA (PRENATAL 1 + 1) 27-1 MG TABS Take 1 tablet by mouth daily.     Yes Historical Provider, MD     Positive ROS: none   All other systems have been reviewed and were otherwise negative with the exception of those mentioned in the HPI and as above.  Physical Exam: Filed Vitals:   12/28/11 0921  BP: 146/85  Pulse: 68  Temp: 98.2 F (36.8 C)  Resp: 20    General: Alert, no acute distress Cardiovascular: No pedal edema Respiratory: No cyanosis, no use of accessory musculature GI: No organomegaly, abdomen is soft and non-tender Skin: No lesions in the area of chief complaint Neurologic: Sensation intact distally Psychiatric: Patient is competent for consent with normal mood and affect Lymphatic: No axillary or cervical  lymphadenopathy  MUSCULOSKELETAL: l knee rom 10-80 ttp over lat side. Locking thru rom  Assessment/Plan: mmt left knee Plan for Procedure(s): ARTHROSCOPY KNEE  The risks benefits and alternatives were discussed with the patient including but not limited to the risks of nonoperative treatment, versus surgical intervention including infection, bleeding, nerve injury, malunion, nonunion, hardware prominence, hardware failure, need for hardware removal, blood clots, cardiopulmonary complications, morbidity, mortality, among others, and they were willing to proceed.  Predicted outcome is good, although there will be at least a six to nine month expected recovery.  Amberlynn Tempesta L, MD 12/28/2011 10:12 AM

## 2011-12-28 NOTE — Transfer of Care (Signed)
Immediate Anesthesia Transfer of Care Note  Patient: Lauren Mcdonald  Procedure(s) Performed:  ARTHROSCOPY KNEE - chondroplasty, medial femoral chondial, medial plica excision  Patient Location: PACU  Anesthesia Type: General  Level of Consciousness: awake  Airway & Oxygen Therapy: Patient Spontanous Breathing and Patient connected to face mask oxygen  Post-op Assessment: Report given to PACU RN and Post -op Vital signs reviewed and stable  Post vital signs: Reviewed and stable  Complications: No apparent anesthesia complications

## 2011-12-28 NOTE — Anesthesia Procedure Notes (Signed)
Procedure Name: LMA Insertion Date/Time: 12/28/2011 10:27 AM Performed by: Meyer Russel Pre-anesthesia Checklist: Patient identified, Emergency Drugs available, Suction available, Patient being monitored and Timeout performed Patient Re-evaluated:Patient Re-evaluated prior to inductionOxygen Delivery Method: Circle System Utilized Preoxygenation: Pre-oxygenation with 100% oxygen Intubation Type: IV induction Ventilation: Mask ventilation without difficulty LMA: LMA inserted LMA Size: 4.0 Number of attempts: 1 Airway Equipment and Method: bite block Placement Confirmation: positive ETCO2 and breath sounds checked- equal and bilateral Tube secured with: Tape Dental Injury: Teeth and Oropharynx as per pre-operative assessment

## 2011-12-28 NOTE — Anesthesia Preprocedure Evaluation (Addendum)
Anesthesia Evaluation  Patient identified by MRN, date of birth, ID band Patient awake    Reviewed: Allergy & Precautions, H&P , NPO status , Patient's Chart, lab work & pertinent test results  History of Anesthesia Complications Negative for: history of anesthetic complications  Airway Mallampati: I TM Distance: >3 FB Neck ROM: Full    Dental No notable dental hx. (+) Teeth Intact and Dental Advisory Given   Pulmonary neg pulmonary ROS,  clear to auscultation  Pulmonary exam normal       Cardiovascular neg cardio ROS Regular Normal    Neuro/Psych Negative Neurological ROS     GI/Hepatic negative GI ROS, Neg liver ROS,   Endo/Other    Renal/GU negative Renal ROS     Musculoskeletal negative musculoskeletal ROS (+)   Abdominal   Peds  Hematology negative hematology ROS (+)   Anesthesia Other Findings   Reproductive/Obstetrics Has IUD, is 4 months post-partum, nursing baby                           Anesthesia Physical Anesthesia Plan  ASA: I  Anesthesia Plan: General   Post-op Pain Management:    Induction: Intravenous  Airway Management Planned: LMA  Additional Equipment:   Intra-op Plan:   Post-operative Plan:   Informed Consent: I have reviewed the patients History and Physical, chart, labs and discussed the procedure including the risks, benefits and alternatives for the proposed anesthesia with the patient or authorized representative who has indicated his/her understanding and acceptance.   Dental advisory given  Plan Discussed with: CRNA and Surgeon  Anesthesia Plan Comments: (Plan routine monitors, GA- LMA OK.  Patient understands that if she requires narcotic medicines to express and discard milk to avoid transference of medicines to baby through breast milk.)       Anesthesia Quick Evaluation

## 2011-12-31 NOTE — Op Note (Signed)
NAME:  [name], SZUCH NO.:  MEDICAL RECORD NO.:  [id number]  LOCATION:                                 FACILITY:  PHYSICIAN:  Harvie Junior, M.D.   DATE OF BIRTH:  1981/02/07  DATE OF PROCEDURE:  12/28/2011 DATE OF DISCHARGE:                              OPERATIVE REPORT   PREOPERATIVE DIAGNOSIS:  Medial meniscal tear with limited extension and flexion.  POSTOPERATIVE DIAGNOSES: 1. Synovial proliferation anteromedially and anterolaterally. 2. Chondromalacia of medial femoral condyle.  PRINCIPAL PROCEDURE: 1. Operative knee arthroscopy with medial and lateral synovial     resection anteriorly. 2. Debridement of medial femoral condyle.  SURGEON:  Harvie Junior, MD  ASSISTANT:  Marshia Ly, PA  ANESTHESIA:  General.  BRIEF HISTORY:  Lauren Mcdonald is a 31 year old female with long history having significant complaints of left knee pain.  She was seen in the office and had inability to extend the leg after twisting injury and inability to straighten the leg.  Ultrasound was performed at that time showed  that she had a medial meniscal tear and because of the patient having a locked knee, she was taken to the operating room for operative knee arthroscopy.  PROCEDURE:  The patient was taken to the operating room.  After adequate level of anesthesia was obtained with general anesthetic, the patient was placed supine on the operating table. The left leg was then prepped and draped in usual fashion.  Following this routine arthroscopic examination revealed, when we entered the knee, there was definitely blood and hemorrhage in the knee.  We then did a routine arthroscopy, which showed that the patella was midline tracking.  We came down anteromedially.  There was medial plica with the tremendous amount of synovial proliferation anteromedially.  We debrided this back to a smooth and stable rim to a good long look at the medial meniscus. Anterior horn  was stabilized.  Anterior mid was also stabilized and there was a normal wrinkle.  Posteriorly, there was a little bit of clot and hemorrhage back by the very posterior root.  We took a probe back down and probed at length.  There was no injury that was allowing the posterior horn to displace or be displaceable.  At this point, attention was turned to the ACL, where there was sort of a large anterior plica, which was debrided and this was debrided giving access to the ACL, which was normal.  Laterally, anterior mid body and posterior of the meniscus were within normal limits.  Lateral femoral condyle was probed at length and no obvious injury there.  We went back up into the patellofemoral joint.  There is a little bit of chondromalacia in the patellofemoral joint, but not dramatic. Tracking was midline.  At this point, the knee was copiously and thoroughly lavaged and suctioned dry.  All sterile portals were closed with a bandage.  Sterile compressive dressing was applied. The patient was taken to recovery room and was noted to be in satisfactory condition.  Estimated blood loss for this procedure was none.     Harvie Junior, M.D.     Ranae Plumber  D:  12/28/2011  T:  12/29/2011  Job:  [id number]

## 2012-01-02 ENCOUNTER — Encounter (HOSPITAL_BASED_OUTPATIENT_CLINIC_OR_DEPARTMENT_OTHER): Payer: Self-pay | Admitting: Orthopedic Surgery

## 2013-07-13 ENCOUNTER — Ambulatory Visit: Payer: BC Managed Care – PPO

## 2013-07-13 ENCOUNTER — Ambulatory Visit (INDEPENDENT_AMBULATORY_CARE_PROVIDER_SITE_OTHER): Payer: BC Managed Care – PPO | Admitting: Family Medicine

## 2013-07-13 VITALS — BP 122/80 | HR 66 | Temp 98.0°F | Resp 16 | Ht 62.0 in | Wt 156.0 lb

## 2013-07-13 DIAGNOSIS — S99922A Unspecified injury of left foot, initial encounter: Secondary | ICD-10-CM

## 2013-07-13 DIAGNOSIS — S8990XA Unspecified injury of unspecified lower leg, initial encounter: Secondary | ICD-10-CM

## 2013-07-13 DIAGNOSIS — M899 Disorder of bone, unspecified: Secondary | ICD-10-CM

## 2013-07-13 MED ORDER — PREDNISONE 20 MG PO TABS: ORAL_TABLET | ORAL | Status: DC

## 2013-07-13 NOTE — Patient Instructions (Addendum)
Sesamoid Injury  Sesamoid bones are bones that are completely enclosed by a tendon. The most recognizable sesamoid bone is the kneecap (patella). Your body also has sesamoid bones in the hands and feet. Sesamoid bones of the feet are more commonly injured than those of the hand. Sesamoid bones in the feet may be injured because of the force placed on them while standing, walking, running, or jumping. Sesamoid injuries include:   Inflammation of the sesamoid (sesamoiditis).  Fracture.  Stress fracture. The sesamoid bone on the base of the big toe is especially susceptible. SYMPTOMS   Pain with weight bearing on the foot, such as with standing, walking, running, jumping, or dancing.  Pain with trying to lift the big toe.  Tenderness and swelling under the base of the big toe. CAUSES  A sesamoid injury is typically caused by acute trauma or overuse trauma to the foot. This may include jumping and landing on the ball of the foot or jumping or dancing on the balls of the feet. Other causes include:  Interrupted blood supply (avascular necrosis).  Infection. RISK INCREASES WITH:  Sports that require jumping from a great height or repeated jumping or standing on the balls of the feet. These include:  Basketball.  Ballet.  Jogging.  Long-distance running.  Shoes that are too small or have very high heels.  Large or poorly shaped sesamoid bone.  Bunions. PREVENTION  Warm up and stretch properly before activity.  Maintain appropriate conditioning:  Ankle and leg flexibility.  Muscle strength and endurance.  Learn and use proper technique and have a coach correct improper technique.  Wear taping, protective strapping, bracing, or padding.  Wear shoes that are the proper size and ensure correct fit. PROGNOSIS If detected early and treated properly, sesamoid injuries are usually curable within 4 to 6 months.  RELATED COMPLICATIONS   Prolonged healing time if not  appropriately treated or if not given enough time to heal.  Fracture does not heal (nonunion).  Prolonged disability.  Frequent recurrence of symptoms. Appropriately addressing the problem with rehabilitation decreases frequency of recurrence and optimizes healing time.  Arthritis of the joint between the sesamoid and the rest of the big toe.  Complications of surgery, including infection, bleeding, injury to nerves, continued pain, bunion or reverse bunion formation, toe weakness, and toe hyperextension. TREATMENT Treatment initially involves the use of ice and medication to reduce pain and inflammation. It may be recommended for you to modify your activities, so they do not cause an increase in the severity of symptoms. Depending on the severity of the injury, you may be required to use crutches in order to keep weight off of the injury. Padding, bracing, or taping the area may help reduce pain. Casting of the leg and foot, a walking boot, or a stiff-soled shoe (with or without an arch support) may also be helpful. For cases of chronic sesamoid symptoms, the use of physical therapy may be recommended. On occasion, corticosteroid injections are given to reduce inflammation. It is uncommon, but possible, for surgery to be necessary to remove the sesamoid bone. MEDICATION   If pain medication is necessary, nonsteroidal anti-inflammatory medications such as aspirin and ibuprofen or other minor pain relievers such as acetaminophen are often recommended.  Do not take pain medication for 7 days before surgery.  Prescription pain relievers are usually only prescribed after surgery. Use only as directed and only as much as you need.  Corticosteroid injections may be given to reduce inflammation. However, these  Do not take pain medication for 7 days before surgery.  · Prescription pain relievers are usually only prescribed after surgery. Use only as directed and only as much as you need.  · Corticosteroid injections may be given to reduce inflammation. However, these injections may only be given a certain number of times.  HEAT AND COLD  Cold treatment (icing) relieves pain and reduces inflammation. Cold treatment should be applied for 10 to 15 minutes every  2 to 3 hours for inflammation and pain and immediately after any activity that aggravates your symptoms. Use ice packs or massage the area with a piece of ice (ice massage).  SEEK MEDICAL CARE IF:   · Symptoms get worse or do not improve in 6 weeks despite treatment.  · Any signs of infection develop, including fever, headaches, muscular aches and weakness, fatigue, redness, warmth, or increased swelling or pain.  · Any of the following occur after surgery:  · You experience pain, numbness, or coldness in the foot and ankle.  · Blue, gray, or dark color appears in the toenails.  · Signs of infection develop, including fever, increased pain, swelling, redness, drainage, or bleeding in the surgical area.  · New, unexplained symptoms develop (drugs used in treatment may produce side effects including bleeding, stomach upset, and allergic reactions).  Document Released: 11/12/2005 Document Revised: 02/04/2012 Document Reviewed: 02/24/2009  ExitCare® Patient Information ©2014 [employer].

## 2013-07-13 NOTE — Progress Notes (Signed)
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 She comes in with spontaneous left foot pain under the distal aspect of her left fifth metatarsal. She reports no trauma or recent change in activity. Her job does not require her to be on her feet a great deal.  Objective: Examination of the feet reveals slight swelling under the head of the left fifth metatarsal. She's tender that volar surface as well. Range of motion of the foot is normal and is otherwise without abnormality. Para [facility] reading (PRIMARY) by  Dr. Milus Glazier:  Left foot. Smooth Surfaced sesamoid bone medial to the head of the fifth metatarsal  Assessment: Sesamoiditis  Plan:Injury of left foot, initial encounter - Plan: DG Foot Complete Left  Sesamoid pain - Plan: predniSONE (DELTASONE) 20 MG tablet  Signed, Elvina Sidle, MD

## 2014-11-26 NOTE — L&D Delivery Note (Signed)
Delivery Note  First Stage: Labor onset: 1330 Augmentation : pitocin Analgesia /Anesthesia intrapartum: Fentanyl x 1 dose PROM at 0400  Second Stage: Complete dilation at 1517 Onset of pushing at 1517 FHR second stage 135  Delivery of a viable female at 1537 by CNM in LOA position No nuchal cord Cord double clamped after cessation of pulsation, cut by FOB Cord blood sample collected   Collection of cord blood donation planned - not accepted by [facility] d/t unclamped cord  Third Stage: Placenta delivered via Tomasa Blase intact with 3 VC @ 1547 Placenta disposition: L&D Uterine tone firm / bleeding moderate, brisk  2nd laceration identified  Anesthesia for repair: 1% lidocaine Repair 2.0 & 4.0 vicryl Est. Blood Loss (mL): 828  Complications: Stage 1 PPH Plan: Methergine 0.2 mg IM now / CBC now  Mom to postpartum.  Baby to Couplet care / Skin to Skin.  Newborn: Birth Weight: 6 lbs 4 oz  Apgar Scores: 9/9 Feeding planned: breast  [name], M  MSN, CNM 08/11/2015, 4:27 PM

## 2015-08-11 ENCOUNTER — Inpatient Hospital Stay (HOSPITAL_COMMUNITY)
Admission: AD | Admit: 2015-08-11 | Discharge: 2015-08-13 | DRG: 774 | Disposition: A | Discharge status: Home / Self Care | Payer: BC Managed Care – PPO | Source: Ambulatory Visit | Attending: Obstetrics | Admitting: Obstetrics

## 2015-08-11 ENCOUNTER — Encounter (HOSPITAL_COMMUNITY): Payer: Self-pay | Admitting: *Deleted

## 2015-08-11 DIAGNOSIS — Z3A37 37 weeks gestation of pregnancy: Secondary | ICD-10-CM | POA: Diagnosis present

## 2015-08-11 DIAGNOSIS — O26893 Other specified pregnancy related conditions, third trimester: Secondary | ICD-10-CM | POA: Diagnosis present

## 2015-08-11 DIAGNOSIS — D509 Iron deficiency anemia, unspecified: Secondary | ICD-10-CM | POA: Diagnosis present

## 2015-08-11 DIAGNOSIS — O4292 Full-term premature rupture of membranes, unspecified as to length of time between rupture and onset of labor: Principal | ICD-10-CM | POA: Diagnosis present

## 2015-08-11 DIAGNOSIS — O9081 Anemia of the puerperium: Secondary | ICD-10-CM | POA: Diagnosis present

## 2015-08-11 DIAGNOSIS — O99019 Anemia complicating pregnancy, unspecified trimester: Secondary | ICD-10-CM

## 2015-08-11 DIAGNOSIS — O4290 Premature rupture of membranes, unspecified as to length of time between rupture and onset of labor, unspecified weeks of gestation: Secondary | ICD-10-CM | POA: Diagnosis present

## 2015-08-11 DIAGNOSIS — Z6791 Unspecified blood type, Rh negative: Secondary | ICD-10-CM | POA: Diagnosis not present

## 2015-08-11 DIAGNOSIS — O26899 Other specified pregnancy related conditions, unspecified trimester: Secondary | ICD-10-CM | POA: Diagnosis present

## 2015-08-11 DIAGNOSIS — D62 Acute posthemorrhagic anemia: Secondary | ICD-10-CM | POA: Diagnosis present

## 2015-08-11 LAB — OB RESULTS CONSOLE HEPATITIS B SURFACE ANTIGEN: HEP B S AG: NEGATIVE

## 2015-08-11 LAB — CBC
HCT: 30.8 % — ABNORMAL LOW (ref 36.0–46.0)
HCT: 30 % — ABNORMAL LOW (ref 36.0–46.0)
HEMOGLOBIN: 10 g/dL — AB (ref 12.0–15.0)
Hemoglobin: 9.8 g/dL — ABNORMAL LOW (ref 12.0–15.0)
MCH: 27.5 pg (ref 26.0–34.0)
MCH: 27.4 pg (ref 26.0–34.0)
MCHC: 32.5 g/dL (ref 30.0–36.0)
MCHC: 32.7 g/dL (ref 30.0–36.0)
MCV: 84.6 fL (ref 78.0–100.0)
MCV: 83.8 fL (ref 78.0–100.0)
PLATELETS: 175 10*3/uL (ref 150–400)
PLATELETS: 179 10*3/uL (ref 150–400)
RBC: 3.64 MIL/uL — AB (ref 3.87–5.11)
RBC: 3.58 MIL/uL — AB (ref 3.87–5.11)
RDW: 13.5 % (ref 11.5–15.5)
RDW: 13.5 % (ref 11.5–15.5)
WBC: 10.1 10*3/uL (ref 4.0–10.5)
WBC: 12 10*3/uL — AB (ref 4.0–10.5)

## 2015-08-11 LAB — OB RESULTS CONSOLE ANTIBODY SCREEN: Antibody Screen: NEGATIVE

## 2015-08-11 LAB — OB RESULTS CONSOLE RPR: RPR: NONREACTIVE

## 2015-08-11 LAB — OB RESULTS CONSOLE HIV ANTIBODY (ROUTINE TESTING): HIV: NONREACTIVE

## 2015-08-11 LAB — OB RESULTS CONSOLE ABO/RH: RH TYPE: NEGATIVE

## 2015-08-11 LAB — OB RESULTS CONSOLE GBS: GBS: NEGATIVE

## 2015-08-11 LAB — OB RESULTS CONSOLE RUBELLA ANTIBODY, IGM: Rubella: IMMUNE

## 2015-08-11 MED ORDER — DIPHENHYDRAMINE HCL 25 MG PO CAPS: 25.0000 mg | ORAL_CAPSULE | Freq: Four times a day (QID) | ORAL | Status: DC | PRN

## 2015-08-11 MED ORDER — CITRIC ACID-SODIUM CITRATE 334-500 MG/5ML PO SOLN: 30.0000 mL | ORAL | Status: DC | PRN

## 2015-08-11 MED ORDER — SIMETHICONE 80 MG PO CHEW: 80.0000 mg | CHEWABLE_TABLET | ORAL | Status: DC | PRN

## 2015-08-11 MED ORDER — FERROUS SULFATE 325 (65 FE) MG PO TABS
325.0000 mg | ORAL_TABLET | Freq: Two times a day (BID) | ORAL | Status: DC
  Administered 2015-08-12 – 2015-08-13 (×3): 325 mg via ORAL
  Filled 2015-08-11 (×3): qty 1

## 2015-08-11 MED ORDER — FENTANYL CITRATE (PF) 100 MCG/2ML IJ SOLN
100.0000 ug | INTRAMUSCULAR | Status: DC | PRN
  Administered 2015-08-11: 100 ug via INTRAVENOUS

## 2015-08-11 MED ORDER — OXYCODONE-ACETAMINOPHEN 5-325 MG PO TABS: 2.0000 | ORAL_TABLET | ORAL | Status: DC | PRN

## 2015-08-11 MED ORDER — FENTANYL CITRATE (PF) 100 MCG/2ML IJ SOLN
INTRAMUSCULAR | Status: AC
  Filled 2015-08-11: qty 2

## 2015-08-11 MED ORDER — ONDANSETRON HCL 4 MG/2ML IJ SOLN: 4.0000 mg | INTRAMUSCULAR | Status: DC | PRN

## 2015-08-11 MED ORDER — DIBUCAINE 1 % RE OINT: 1.0000 "application " | TOPICAL_OINTMENT | RECTAL | Status: DC | PRN

## 2015-08-11 MED ORDER — METHYLERGONOVINE MALEATE 0.2 MG/ML IJ SOLN
0.2000 mg | Freq: Once | INTRAMUSCULAR | Status: AC
  Administered 2015-08-11: 0.2 mg via INTRAMUSCULAR
  Filled 2015-08-11: qty 1

## 2015-08-11 MED ORDER — SENNOSIDES-DOCUSATE SODIUM 8.6-50 MG PO TABS
2.0000 | ORAL_TABLET | ORAL | Status: DC
  Administered 2015-08-11 – 2015-08-13 (×2): 2 via ORAL
  Filled 2015-08-11 (×2): qty 2

## 2015-08-11 MED ORDER — PRENATAL MULTIVITAMIN CH
1.0000 | ORAL_TABLET | Freq: Every day | ORAL | Status: DC
  Administered 2015-08-12 – 2015-08-13 (×2): 1 via ORAL
  Filled 2015-08-11 (×2): qty 1

## 2015-08-11 MED ORDER — LIDOCAINE HCL (PF) 1 % IJ SOLN
30.0000 mL | INTRAMUSCULAR | Status: DC | PRN
  Administered 2015-08-11: 30 mL via SUBCUTANEOUS
  Filled 2015-08-11: qty 30

## 2015-08-11 MED ORDER — ACETAMINOPHEN 325 MG PO TABS
650.0000 mg | ORAL_TABLET | ORAL | Status: DC | PRN
  Administered 2015-08-12: 650 mg via ORAL
  Filled 2015-08-11: qty 2

## 2015-08-11 MED ORDER — OXYTOCIN BOLUS FROM INFUSION: 500.0000 mL | INTRAVENOUS | Status: DC

## 2015-08-11 MED ORDER — ONDANSETRON HCL 4 MG PO TABS: 4.0000 mg | ORAL_TABLET | ORAL | Status: DC | PRN

## 2015-08-11 MED ORDER — ZOLPIDEM TARTRATE 5 MG PO TABS: 5.0000 mg | ORAL_TABLET | Freq: Every evening | ORAL | Status: DC | PRN

## 2015-08-11 MED ORDER — OXYTOCIN 40 UNITS IN LACTATED RINGERS INFUSION - SIMPLE MED
62.5000 mL/h | INTRAVENOUS | Status: DC
  Filled 2015-08-11: qty 1000

## 2015-08-11 MED ORDER — LACTATED RINGERS IV SOLN: 500.0000 mL | INTRAVENOUS | Status: DC | PRN

## 2015-08-11 MED ORDER — IBUPROFEN 600 MG PO TABS
600.0000 mg | ORAL_TABLET | Freq: Four times a day (QID) | ORAL | Status: DC
  Administered 2015-08-11 – 2015-08-13 (×8): 600 mg via ORAL
  Filled 2015-08-11 (×8): qty 1

## 2015-08-11 MED ORDER — ACETAMINOPHEN 325 MG PO TABS: 650.0000 mg | ORAL_TABLET | ORAL | Status: DC | PRN

## 2015-08-11 MED ORDER — INFLUENZA VAC SPLIT QUAD 0.5 ML IM SUSY
0.5000 mL | PREFILLED_SYRINGE | INTRAMUSCULAR | Status: AC
  Administered 2015-08-12: 0.5 mL via INTRAMUSCULAR
  Filled 2015-08-11: qty 0.5

## 2015-08-11 MED ORDER — METHYLERGONOVINE MALEATE 0.2 MG/ML IJ SOLN: 0.2000 mg | INTRAMUSCULAR | Status: DC | PRN

## 2015-08-11 MED ORDER — MAGNESIUM OXIDE 400 (241.3 MG) MG PO TABS
200.0000 mg | ORAL_TABLET | Freq: Every day | ORAL | Status: DC
  Administered 2015-08-11 – 2015-08-13 (×3): 200 mg via ORAL
  Filled 2015-08-11 (×4): qty 0.5

## 2015-08-11 MED ORDER — OXYCODONE-ACETAMINOPHEN 5-325 MG PO TABS: 1.0000 | ORAL_TABLET | ORAL | Status: DC | PRN

## 2015-08-11 MED ORDER — WITCH HAZEL-GLYCERIN EX PADS: 1.0000 "application " | MEDICATED_PAD | CUTANEOUS | Status: DC | PRN

## 2015-08-11 MED ORDER — BENZOCAINE-MENTHOL 20-0.5 % EX AERO
1.0000 "application " | INHALATION_SPRAY | CUTANEOUS | Status: DC | PRN
  Administered 2015-08-11: 1 via TOPICAL
  Filled 2015-08-11: qty 56

## 2015-08-11 MED ORDER — TERBUTALINE SULFATE 1 MG/ML IJ SOLN
0.2500 mg | Freq: Once | INTRAMUSCULAR | Status: DC | PRN
  Filled 2015-08-11: qty 1

## 2015-08-11 MED ORDER — ONDANSETRON HCL 4 MG/2ML IJ SOLN: 4.0000 mg | Freq: Four times a day (QID) | INTRAMUSCULAR | Status: DC | PRN

## 2015-08-11 MED ORDER — OXYTOCIN 40 UNITS IN LACTATED RINGERS INFUSION - SIMPLE MED
1.0000 m[IU]/min | INTRAVENOUS | Status: DC
  Administered 2015-08-11: 666 m[IU]/min via INTRAVENOUS
  Administered 2015-08-11: 2 m[IU]/min via INTRAVENOUS

## 2015-08-11 MED ORDER — METHYLERGONOVINE MALEATE 0.2 MG PO TABS
0.2000 mg | ORAL_TABLET | ORAL | Status: DC | PRN
  Administered 2015-08-11 – 2015-08-12 (×3): 0.2 mg via ORAL
  Filled 2015-08-11 (×3): qty 1

## 2015-08-11 MED ORDER — TETANUS-DIPHTH-ACELL PERTUSSIS 5-2.5-18.5 LF-MCG/0.5 IM SUSP: 0.5000 mL | Freq: Once | INTRAMUSCULAR | Status: DC

## 2015-08-11 MED ORDER — LACTATED RINGERS IV SOLN
INTRAVENOUS | Status: DC
  Administered 2015-08-11: 12:00:00 via INTRAVENOUS

## 2015-08-11 NOTE — Progress Notes (Signed)
Patient ID: Lauren Mcdonald, female   DOB: March 24, 1981, 34 y.o.   MRN: [id number] S: Doing well, pain tolerable after receiving Fentanyl. (+)  pelvic pressure with contractions.  No urge to push at this time   O: Filed Vitals:   08/11/15 1302 08/11/15 1336 08/11/15 1411 08/11/15 1511  BP: 122/81 130/79 127/77 125/76  Pulse: 75 89 82 83  Temp:      TempSrc:      Resp:   18 20  Height:      Weight:         FHT:  FHR: 135 bpm, variability: moderate,  accelerations:  Present,  decelerations:  Present variables UC:   regular, every 2-4 minutes SVE:   Dilation: 8 cm Effacement (%): 100 Station: +1 Exam by:: Herma Carson, RN   A / P: Spontaneous labor, progressing normally  Fetal Wellbeing:  Category I Pain Control:  Fentanyl  Anticipated MOD:  NSVD  [name], Judie Petit MSN, CNM 08/11/2015, 3:00 PM

## 2015-08-11 NOTE — H&P (Signed)
Lauren Mcdonald is a 34 y.o. G2P1001 at 37'5 by LMP c/w 7 wk u/s  presenting for IOL due to SROM. Pt notes rare contractions. Good fetal movement, No vaginal bleeding, started leaking fluid at 4am, confirmed ROM at office visit today and send for pitocin IOL.  [facility] at [facility] since 7 wks - Rh neg, s/p Rhogam, repeat PP - h/o anemia, no longer on iron - prior SVD at 38 wks - h/o borderline PCOS, not on metformin, nl early and 28 wk DS - low lying placenta which resolved - planning cord blood donation   Prenatal Transfer Tool  Maternal Diabetes: No Genetic Screening: Normal Maternal Ultrasounds/Referrals: Normal Fetal Ultrasounds or other Referrals:  None Maternal Substance Abuse:  No Significant Maternal Medications:  None Significant Maternal Lab Results: None     OB History    Gravida Para Term Preterm AB TAB SAB Ectopic Multiple Living   Past Medical History  Diagnosis Date  . No pertinent past medical history   . Indication for care in labor or delivery 08/11/2015   Past Surgical History  Procedure Laterality Date  . No past surgeries    . Wisdom teeth extracted    . Knee arthroscopy  12/28/2011    Procedure: ARTHROSCOPY KNEE;  Surgeon: Harvie Junior, MD;  Location: [facility];  Service: Orthopedics;  Laterality: Left;  chondroplasty, medial femoral chondial, medial plica excision   Family History: family history is not on file. Social History:  reports that she has never smoked. She does not have any smokeless tobacco history on file. She reports that she does not drink alcohol or use illicit drugs.  Review of Systems - Negative except ROM   Dilation: 2 Effacement (%): 80 Station: -2 Exam by:: Herma Carson, RN Blood pressure 122/81, pulse 75, temperature 98.4 F (36.9 C), temperature source Oral, resp. rate 16, height  (1.575 m), weight 83.915 kg (185 lb).  Physical Exam:  Filed Vitals:   08/11/15 1302  BP: 122/81   Pulse: 75  Temp:   Resp:    Gen: well appearing, no distressT Abd: gravid, NT, no RUQ pain, no fundal tenderness LE: no edema, equal bilaterally, non-tender Toco: q 5 min, pit at 6 munits/ min FH: baseline 140s, accelerations present, no deceleratons, 10 beat variability  Prenatal labs: ABO, Rh: --/--/A NEG (09/15 1221) Antibody: PENDING (09/15 1221) Rubella:  immune RPR: Nonreactive (09/15 0000)  HBsAg: Negative (09/15 0000)  HIV: Non-reactive (09/15 0000)  GBS: Negative (09/15 0000)  1 hr Glucola 115  Genetic screening normal NT, nl AFP Anatomy US normal   Assessment/Plan: 34 y.o. G2P1001 at 37'5  With PROM - PROM w/o labor, pitocin started, expect SVD - RH neg - reactive fetal testing   Dalaney Needle A. 08/11/2015, 1:13 PM

## 2015-08-11 NOTE — Progress Notes (Signed)
Patient ID:Lauren Mcdonald, female   DOB: 08-04-1981, 34 y.o.   MRN: [id number]   Subjective: Lauren Mcdonald is a 34 y.o. G2P1001 at 35.4 wks by LMP admitted for induction of labor due to PROM. H/O rapid labor.  Objective: Filed Vitals:   08/11/15 1223 08/11/15 1302 08/11/15 1336 08/11/15 1411  BP: 116/94 122/81 130/79 127/77  Pulse: 81 75 89 82  Temp:      TempSrc:      Resp: 16   18  Height:      Weight:            FHT:  FHR: 130 bpm, variability: moderate,  accelerations:  Present,  decelerations:  Absent UC:   regular, every 4 minutes SVE:   Dilation: 4 Effacement (%): 90 Station: -1 Exam by:: [employer], rn  Labs:    Recent Labs  08/11/15 1221  WBC 10.1  HGB 10.0*  HCT 30.8*  PLT 175    Assessment / Plan: Spontaneous labor, progressing normally  Labor: Progressing normally Preeclampsia:  N/A Fetal Wellbeing:  Category I Pain Control:  Fentanyl I/D:  n/a Anticipated MOD:  NSVD  Kenard Gower, MSN, CNM 08/11/2015, 2:41 PM

## 2015-08-11 NOTE — Progress Notes (Signed)
Report to Lindsey RN.

## 2015-08-12 ENCOUNTER — Encounter (HOSPITAL_COMMUNITY): Payer: Self-pay | Admitting: Obstetrics and Gynecology

## 2015-08-12 LAB — CBC
HEMATOCRIT: 30.1 % — AB (ref 36.0–46.0)
HEMOGLOBIN: 9.9 g/dL — AB (ref 12.0–15.0)
MCH: 27.6 pg (ref 26.0–34.0)
MCHC: 32.9 g/dL (ref 30.0–36.0)
MCV: 83.8 fL (ref 78.0–100.0)
Platelets: 175 10*3/uL (ref 150–400)
RBC: 3.59 MIL/uL — AB (ref 3.87–5.11)
RDW: 13.4 % (ref 11.5–15.5)
WBC: 13.4 10*3/uL — AB (ref 4.0–10.5)

## 2015-08-12 LAB — RPR: RPR: NONREACTIVE

## 2015-08-12 NOTE — Progress Notes (Signed)
PPD 1 SVD  S:  Reports feeling well             Tolerating po/ No nausea or vomiting             Bleeding is light             Pain controlled with motrin             Up ad lib / ambulatory / voiding QS  Newborn breast feeding  / Circumcision planned  O:               VS: BP 121/74 mmHg  Pulse 51  Temp(Src) 98 F (36.7 C) (Oral)  Resp 18  Ht  (1.575 m)  Wt 83.915 kg (185 lb)  BMI 33.83 kg/m2  SpO2 99%  LMP 11/21/2014 (Exact Date)  Breastfeeding? Unknown   LABS:              Recent Labs  08/11/15 1630 08/12/15 0535  WBC 12.0* 13.4*  HGB 9.8* 9.9*  PLT 179 175               Blood type: --/--/A NEG (09/15 1221)  Rubella: Immune (09/15 0000)                     I&O: Intake/Output      09/15 0701 - 09/16 0700 09/16 0701 - 09/17 0700   Blood 828    Total Output 828     Net -828                        Physical Exam:             Alert and oriented X3  Abdomen: soft, non-tender, non-distended              Fundus: firm, non-tender, U-1  Perineum: no edema  Lochia: light  Extremities: no edema, no calf pain or tenderness    A: PPD # 1   Doing well - stable status  P: Routine post partum orders  Anticipate DC tomorrow  Marlinda Mike CNM, MSN, [facility] 08/12/2015, 9:29 AM

## 2015-08-12 NOTE — Lactation Note (Signed)
This note was copied from the chart of Lauren Mcdonald. Lactation Consultation Note Experienced BF mom BF her 35 yr old daughter for 10 months. Has Hx: of PCOS. Had adequate Breast tissue w/good everted nipples. Stated had adequate milk supply w/her daughter and didn't have to supplement.  Baby has been sleepy since last BF at 0030. Encouraged mom to wake baby for feedings if baby doesn't cue in 3 hrs.  Mom shown how to use DEBP & how to disassemble, clean, & reassemble parts.Mom knows to pump q3h for 15-20 min. Mom started pumping for breast stimulation. Mom encouraged to feed baby 8-12 times/24 hours and with feeding cues. Mom encouraged to do skin-to-skin. Educated about newborn behavior of 37 4/7 weeks, I&O, supply and demand. Referred to Baby and Me Book in Breastfeeding section Pg. 22-23 for position options and Proper latch demonstration.WH/LC brochure given w/resources, support groups and LC services.  Patient Name: Lauren Mcdonald [id number] Date: 08/12/2015 Reason for consult: Initial assessment   Maternal Data Has patient been taught Hand Expression?: Yes Does the patient have breastfeeding experience prior to this delivery?: Yes  Feeding    LATCH Score/Interventions       Type of Nipple: Everted at rest and after stimulation  Comfort (Breast/Nipple): Soft / non-tender     Intervention(s): Breastfeeding basics reviewed;Support Pillows;Position options;Skin to skin     Lactation Tools Discussed/Used Tools: Pump Breast pump type: Double-Electric Breast Pump WIC Program: No Pump Review: Setup, frequency, and cleaning;Milk Storage Initiated by:: Peri Jefferson RN Date initiated:: 08/12/15   Consult Status Consult Status: Follow-up Date: 08/13/15 Follow-up type: In-patient    Charyl Dancer 08/12/2015, 5:19 AM

## 2015-08-13 ENCOUNTER — Ambulatory Visit: Payer: Self-pay

## 2015-08-13 DIAGNOSIS — D62 Acute posthemorrhagic anemia: Secondary | ICD-10-CM | POA: Diagnosis not present

## 2015-08-13 DIAGNOSIS — Z6791 Unspecified blood type, Rh negative: Secondary | ICD-10-CM

## 2015-08-13 DIAGNOSIS — D509 Iron deficiency anemia, unspecified: Secondary | ICD-10-CM | POA: Diagnosis present

## 2015-08-13 DIAGNOSIS — O26899 Other specified pregnancy related conditions, unspecified trimester: Secondary | ICD-10-CM | POA: Diagnosis present

## 2015-08-13 DIAGNOSIS — O99019 Anemia complicating pregnancy, unspecified trimester: Secondary | ICD-10-CM

## 2015-08-13 HISTORY — DX: Other specified pregnancy related conditions, unspecified trimester: O26.899

## 2015-08-13 HISTORY — DX: Unspecified blood type, rh negative: Z67.91

## 2015-08-13 MED ORDER — RHO D IMMUNE GLOBULIN 1500 UNIT/2ML IJ SOSY
300.0000 ug | PREFILLED_SYRINGE | Freq: Once | INTRAMUSCULAR | Status: AC
  Administered 2015-08-13: 300 ug via INTRAMUSCULAR
  Filled 2015-08-13: qty 2

## 2015-08-13 MED ORDER — FERROUS SULFATE 325 (65 FE) MG PO TABS: 325.0000 mg | ORAL_TABLET | Freq: Every day | ORAL | Status: DC

## 2015-08-13 MED ORDER — IBUPROFEN 600 MG PO TABS: 600.0000 mg | ORAL_TABLET | Freq: Four times a day (QID) | ORAL | Status: DC

## 2015-08-13 NOTE — Progress Notes (Signed)
PPD #2- SVD  Subjective:   Reports feeling well, ready for discharge Tolerating po/ No nausea or vomiting Bleeding is light Pain controlled with Motrin Up ad lib / ambulatory / voiding without problems Newborn: breast and bottle feeding  / Circumcision: done  Objective:   VS: VS:  Filed Vitals:   08/11/15 2328 08/12/15 0617 08/12/15 1903 08/13/15 0543  BP: 120/69 121/74 141/79 110/89  Pulse: 70 51 65 89  Temp: 98.8 F (37.1 C) 98 F (36.7 C) 98.1 F (36.7 C) 98 F (36.7 C)  TempSrc: Oral Oral Oral   Resp: Height:      Weight:      SpO2: 98% 99%      LABS:  Recent Labs  08/11/15 1630 08/12/15 0535  WBC 12.0* 13.4*  HGB 9.8* 9.9*  PLT 179 175   Blood type: --/--/A NEG (09/17 0750) Rubella: Immune (09/15 0000)                I&O: Intake/Output      09/16 0701 - 09/17 0700 09/17 0701 - 09/18 0700   Blood     Total Output       Net              Physical Exam: Alert and oriented X3 Abdomen: soft, non-tender, non-distended  Fundus: firm, non-tender, U-3 Perineum: Well approximated, no significant erythema, edema, or drainage; healing well. Lochia: small Extremities: No edema, no calf pain or tenderness   Assessment: PPD #2  G2P2002/ S/P:spontaneous vaginal, 2nd degree laceration PPH, delivered IDA with compounding ABL anemia Rh negative-Rhogam ordered Doing well - stable for discharge home  Plan: Discharge home RX's:  Ibuprofen  po Q 6 hrs prn pain #30 Refill x 0 Niferex  po QD #30 Refill x 1 Routine pp visit in 6wks at [facility] booklet given    Donette Larry, N MSN, CNM 08/13/2015, 10:27 AM

## 2015-08-13 NOTE — Lactation Note (Signed)
This note was copied from the chart of Lauren Mcdonald. Lactation Consultation Note  Follow up with mom prior to D/C. Infant at 8 % weight loss. He is [redacted]w[redacted]d 2 do infant who has started taking formula supplement due to weight loss. Infant was awake and alert and cuing to feed, mom reported that he had been at the breast at 9 and 10. I assisted mom with latching to breast. Infant latched easily but needed assistance with obtaining deep latch, encouraged mom to pull infant further onto breast and to maintain his position throughout feeding. Infant did suck briefly with audible swallows but required stimulation to maintain sucking. Mom used breast massage and compression with feeds to maximize milk transfer. Infant was asleep after feed and refused to suck on finger for supplement. Mom has been using syringe feeding to supplement baby while at breast or via syringe finger feeding after as she wishes to avoid bottles at this time. Discussed at length with mom and dad that baby is exhibiting LPTI behavior and the need to maximize feeding efficiency to increase intake for growth. Plan is to see infant for next feeding and to assist mom with using 5 fr feeding tube at breast to provide supplement while at breast. Mom is pumping and received 5cc colostrum this am. Enc mom to pump every 2-3 hours after infant feeds to stimulate milk production. Mom has Medela Pump In Style at home. Mom has Hx PCOS also and was able to BF 3 yo for 8 months and she reported she did not have issues. Caring for your Late Preterm Baby handout given and discussed with family with supplement amounts shared. Gave mom my # to call for next feeding. Enc parents to insure infant is feeding at least Q3hours with awakening as needed offering breast first and supplement after. OP appointment made for Thursday 9/21 @ 1 pm for followup.   Patient Name: Lauren Levon Penning [id number] Date: 08/13/2015 Reason for consult: Follow-up assessment;Infant <  6lbs;Infant weight loss   Maternal Data Formula Feeding for Exclusion: No Has patient been taught Hand Expression?: Yes Does the patient have breastfeeding experience prior to this delivery?: Yes  Feeding Feeding Type: Breast Fed Length of feed: 10 min  LATCH Score/Interventions Latch: Repeated attempts needed to sustain latch, nipple held in mouth throughout feeding, stimulation needed to elicit sucking reflex. Intervention(s): Assist with latch;Breast massage;Breast compression  Audible Swallowing: A few with stimulation Intervention(s): Skin to skin;Hand expression Intervention(s): Skin to skin;Hand expression  Type of Nipple: Everted at rest and after stimulation  Comfort (Breast/Nipple): Soft / non-tender     Hold (Positioning): No assistance needed to correctly position infant at breast. Intervention(s): Breastfeeding basics reviewed;Support Pillows;Position options;Skin to skin  LATCH Score: 8  Lactation Tools Discussed/Used Breast pump type: Double-Electric Breast Pump Pump Review: Setup, frequency, and cleaning;Milk Storage   Consult Status Consult Status: Follow-up Date: 08/13/15 Follow-up type: In-patient    Silas Flood Genene Kilman 08/13/2015, 11:36 AM

## 2015-08-13 NOTE — Lactation Note (Signed)
This note was copied from the chart of Lauren Eri Mcevers. Lactation Consultation Note  Called to moms room for feeding assessment prior to D/C. Mom was feeding infant at the breast when I arrived. She was using good positioning techniques and was massaging breasts while infant feeding. Infant was Intermittently nursing with some swallows heard. Mom is stimulating infant to keep him awake for feeding. 5 Fr feeding tube with syringe of 10cc Alimentum 20 cc placed at breast like SNS. Baby nursed more rhythmically with more swallows noted. He was able to move syringe plunger by self while feeding for 1/2 the feeding, Mom gradually pushed syringe to stimulate sucking as needed. Infant fed well and was asleep post feed. Mom wishes to avoid bottles at this time. Sent home with plan to feed at breast on cue or to awaken to feed at least every 3 hours with 10-20 cc EBM or formula as supplement via SNS or Finger Feeding with 5 fr feeding tube. Mom is to pump 8-12 times in 24 hours post infant feeding for 10-15 minutes and use EBM for supplement. Baby has a Ped. appt on Monday and an OP LC Appt on Thursday 9/21 @ 1 pm. Parents told to call prn questions/concerns, they have LC OP phone number. Parents voiced understanding of plan.  Patient Name: Lauren Mcdonald [id number] Date: 08/13/2015 Reason for consult: Follow-up assessment;Infant < 6lbs;Infant weight loss;Late preterm infant   Maternal Data Has patient been taught Hand Expression?: Yes Does the patient have breastfeeding experience prior to this delivery?: Yes  Feeding Feeding Type: Breast Fed Length of feed: 15 min  LATCH Score/Interventions Latch: Grasps breast easily, tongue down, lips flanged, rhythmical sucking. Intervention(s): Adjust position;Assist with latch;Breast massage  Audible Swallowing: Spontaneous and intermittent Intervention(s): Skin to skin;Hand expression Intervention(s): Skin to skin;Hand expression  Type of Nipple: Everted at  rest and after stimulation  Comfort (Breast/Nipple): Soft / non-tender     Hold (Positioning): No assistance needed to correctly position infant at breast. Intervention(s): Breastfeeding basics reviewed;Support Pillows;Position options;Skin to skin  LATCH Score: 10  Lactation Tools Discussed/Used Tools: 53F feeding tube / Syringe Breast pump type: Double-Electric Breast Pump Pump Review: Setup, frequency, and cleaning;Milk Storage   Consult Status Consult Status: Follow-up Date: 08/18/15 Follow-up type: Out-patient    Silas Flood Hice 08/13/2015, 2:31 PM

## 2015-08-13 NOTE — Discharge Summary (Signed)
Obstetric Discharge Summary Reason for Admission: spontaneous abortion and 37.4 weeks Prenatal Procedures: low-lying placenta-resolved, Rh negative, anemia Intrapartum Procedures: spontaneous vaginal delivery and Pitocin, epidural Postpartum Procedures: Rho(D) Ig Complications-Operative and Postpartum: 2nd  degree perineal laceration and PPH HEMOGLOBIN  Date Value Ref Range Status  08/12/2015 9.9* 12.0 - 15.0 g/dL Final   HCT  Date Value Ref Range Status  08/12/2015 30.1* 36.0 - 46.0 % Final    Physical Exam:  General: alert, cooperative and no distress Lochia: appropriate Uterine Fundus: firm Incision: healing well, no significant drainage, no dehiscence, no significant erythema DVT Evaluation: No evidence of DVT seen on physical exam. Negative Homan's sign. No cords or calf tenderness. No significant calf/ankle edema.  Discharge Diagnoses: Term Pregnancy-delivered  Discharge Information: Date: 08/13/2015 Activity: pelvic rest Diet: routine Medications: PNV, Ibuprofen and Iron Condition: stable Instructions: refer to practice specific booklet Discharge to: home Follow-up Information    Follow up with [facility] A., MD. Schedule an appointment as soon as possible for a visit in 6 weeks.   Specialty:  Obstetrics and Gynecology   Contact information:   [address] [place] [address] [phone]       Newborn Data: Live born female on 08/11/15 Birth Weight: 6 lb 4 oz (2835 g) APGAR: 9, 9  Home with mother.  Leron Stoffers, N 08/13/2015, 10:30 AM

## 2015-08-14 LAB — RH IG WORKUP (INCLUDES ABO/RH)
ABO/RH(D): A NEG
Fetal Screen: NEGATIVE
GESTATIONAL AGE(WKS): 37
Unit division: 0

## 2015-08-14 LAB — TYPE AND SCREEN
ABO/RH(D): A NEG
Antibody Screen: POSITIVE
DAT, IGG: NEGATIVE
UNIT DIVISION: 0
Unit division: 0

## 2015-08-18 ENCOUNTER — Ambulatory Visit (HOSPITAL_COMMUNITY)
Admit: 2015-08-18 | Discharge: 2015-08-18 | Disposition: A | Discharge status: Home / Self Care | Payer: BC Managed Care – PPO | Attending: Obstetrics | Admitting: Obstetrics

## 2015-08-18 NOTE — Lactation Note (Signed)
Lactation Consult                     Mother's reason for visit:  Follow up from hospital discharge Visit Type:  Feeding assessment  Consult:  InitialLactation Consultant:  Michel Bickers  ________________________________________________________________________   This note was copied from the chart of Lauren Mcdonald. Open Chart                      Mother's Name: Lauren Mcdonald , 17 days old , weight today, [phone] ounce gain from Monday at [facility] office Type of delivery:   Breastfeeding Experience:  9-10 months with first child Maternal Medical Conditions:  Post-partum hemorrhage, Mother states that she lost a minimal amt of blood and she was given 4 doses of Methergine , no transfusion was needed.  Maternal Medications:  Prenatal vits, iron daily, Miralax  ________________________________________________________________________  Breastfeeding History (Post Discharge)  Frequency of breastfeeding:  Every 3 hours Duration of feeding: 15-20 mins  Supplementation  Breastmilk: 35 Volume ml Frequency:  Once -twice daily  Method:  Bottle,   Pumping  Type of pump:  Medela pump in style Frequency:  4-6  times daily Volume:  3-4 ounces  Infant Intake and Output Assessment  Voids:  8 in 24 hrs.  Color:  Clear yellow Stools:  6 in 24 hrs.  Color:  Yellow  ________________________________________________________________________  Maternal Breast Assessment  Breast:  Full Nipple:  Erect Pain level:  0 Pain interventions:  Bra  _______________________________________________________________________ Feeding Assessment/Evaluation:   Infant was fed 35 ml of EBM with a bottle one hour before consult. Multiple attempts to rouse infant for feeding. Infant very sleepy and satisfied. Mother's breast are full and firm. She confirms that her breast soften well after infant feeds.  Mother's nipple tissue intact. She denies having any  pain with latch. Mother states that milk came in within 3-4 days.  Infant wakes easily for mother and feeds every 3 hours for 15-20 mins. Mother confirms that infant has a wide open mouth when feeding. She hears swallows. Observed good technique for latching infant with firm support and good postioning.  Infant has had a gain of 3 ounces since Monday at [facility] office.  Follow up instructions: Advised mother to follow up to [facility] or Peds office in one week for weight check  Mother to continue to breastfeed infant 8-12 times in 24 hours Mother to continue to post pump at least 2-4 times daily. Mother advised to increase volume when bottle feeding up to 46-60 ml Prn Lactation visit.

## 2017-12-31 ENCOUNTER — Ambulatory Visit: Payer: BC Managed Care – PPO | Admitting: Physician Assistant

## 2017-12-31 ENCOUNTER — Other Ambulatory Visit: Payer: Self-pay

## 2017-12-31 ENCOUNTER — Encounter: Payer: Self-pay | Admitting: Physician Assistant

## 2017-12-31 VITALS — BP 150/94 | HR 97 | Temp 102.5°F | Resp 18 | Ht 63.27 in | Wt 148.0 lb

## 2017-12-31 DIAGNOSIS — R509 Fever, unspecified: Secondary | ICD-10-CM | POA: Diagnosis not present

## 2017-12-31 DIAGNOSIS — R52 Pain, unspecified: Secondary | ICD-10-CM | POA: Diagnosis not present

## 2017-12-31 DIAGNOSIS — J101 Influenza due to other identified influenza virus with other respiratory manifestations: Secondary | ICD-10-CM | POA: Diagnosis not present

## 2017-12-31 LAB — POCT INFLUENZA A/B
Influenza A, POC: POSITIVE — AB
Influenza B, POC: NEGATIVE

## 2017-12-31 MED ORDER — BALOXAVIR MARBOXIL(40 MG DOSE) 2 X 20 MG PO TBPK: 40.0000 mg | ORAL_TABLET | Freq: Once | ORAL | 0 refills | Status: AC

## 2017-12-31 MED ORDER — OSELTAMIVIR PHOSPHATE 75 MG PO CAPS: 75.0000 mg | ORAL_CAPSULE | Freq: Two times a day (BID) | ORAL | 0 refills | Status: DC

## 2017-12-31 MED ORDER — ACETAMINOPHEN 500 MG PO TABS
1000.0000 mg | ORAL_TABLET | Freq: Once | ORAL | Status: AC
  Administered 2017-12-31: 1000 mg via ORAL

## 2017-12-31 NOTE — Patient Instructions (Addendum)
You are positive for influenza.  Stay well hydrated. Get lost of rest. Wash your hands often.   -Foods that can help speed recovery: honey, garlic, chicken soup, elderberries, green tea.  -Supplements that can help speed recovery: vitamin C, zinc, elderberry extract, quercetin, ginseng, selenium -Supplement with prebiotics and probiotics:   Advil or ibuprofen for pain. Do not take Aspirin.  Drink enough water and fluids to keep your urine clear or pale yellow.  For sore throat: ? Gargle with 8 oz of salt water ( tsp of salt per 1 qt of water) as often as every 1-2 hours to soothe your throat.  Gargle liquid benadryl.  Cepacol throat lozenges (if you are not at risk for choking).  For sore throat try using a honey-based tea. Use 3 teaspoons of honey with juice squeezed from half lemon. Place shaved pieces of ginger into 1/2-1 cup of water and warm over stove top. Then mix the ingredients and repeat every 4 hours as needed.  Cough Syrup Recipe: Sweet Lemon & Honey Thyme  Ingredients a handful of fresh thyme sprigs   1 pint of water (2 cups)  1/2 cup honey (raw is best, but regular will do)  1/2 lemon chopped Instructions 1. Place the lemon in the pint jar and cover with the honey. The honey will macerate the lemons and draw out liquids which taste so delicious! 2. Meanwhile, toss the thyme leaves into a saucepan and cover them with the water. 3. Bring the water to a gentle simmer and reduce it to half, about a cup of tea. 4. When the tea is reduced and cooled a bit, strain the sprigs & leaves, add it into the pint jar and stir it well. 5. Give it a shake and use a spoonful as needed. 6. Store your homemade cough syrup in the refrigerator for about a month.  Is there anything I can do on my own to get rid of my cough? Yes. To help get rid of your cough, you can: ?Use a humidifier in your bedroom ?Use an over-the-counter cough medicine, or suck on cough drops or hard candy ?Stop  smoking, if you smoke ?If you have allergies, avoid the things you are allergic to (like pollen, dust, animals, or mold) If you have acid reflux, your doctor or nurse will tell you which lifestyle changes can help reduce symptoms.  Come back if you are not improving in 5 days, or sooner if your symptoms worsen.   Thank you for coming in today. I hope you feel we met your needs.  Feel free to call PCP if you have any questions or further requests.  Please consider signing up for MyChart if you do not already have it, as this is a great way to communicate with me.  Best,  Whitney McVey, PA-C  IF you received an x-ray today, you will receive an invoice from [facility]. Please contact [facility] at [phone] with questions or concerns regarding your invoice.   IF you received labwork today, you will receive an invoice from [facility]. Please contact [facility] at [phone] with questions or concerns regarding your invoice.   Our billing staff will not be able to assist you with questions regarding bills from these companies.  You will be contacted with the lab results as soon as they are available. The fastest way to get your results is to activate your My Chart account. Instructions are located on the last page of this paperwork. If you have not  heard from [place] regarding the results in 2 weeks, please contact this office.

## 2017-12-31 NOTE — Progress Notes (Signed)
Lauren Mcdonald  MRN: [id number] DOB: 09-Oct-1981  PCP: Georgianne Fickamachandran, Ajith, MD  Subjective:  Pt is a 37 year old female who presents to clinic for cough and fever x 2 days. Her child tested positive for the flu about 5 days ago.  She received the flu shot this season.  Tylenol for fever.   Review of Systems  Constitutional: Positive for fatigue and fever. Negative for chills and diaphoresis.  HENT: Positive for congestion and rhinorrhea. Negative for postnasal drip, sinus pressure, sinus pain and sore throat.   Respiratory: Positive for cough. Negative for shortness of breath and wheezing.   Psychiatric/Behavioral: Positive for sleep disturbance.    Patient Active Problem List   Diagnosis Date Noted  . Iron deficiency anemia of pregnancy 08/13/2015  . Acute blood loss anemia 08/13/2015  . Rh negative status during pregnancy 08/13/2015  . Postpartum hemorrhage 08/13/2015  . Postpartum care following vaginal delivery (9/15) 08/11/2015    Current Outpatient Medications on File Prior to Visit  Medication Sig Dispense Refill  . busPIRone (BUSPAR) 7.5 MG tablet Take 7.5 mg by mouth 2 times daily at 12 noon and 4 pm.    . cetirizine (ZYRTEC) 10 MG tablet Take 10 mg by mouth daily as needed for allergies.    Marland Kitchen. ibuprofen (ADVIL,MOTRIN) 600 MG tablet Take 1 tablet (600 mg total) by mouth every 6 (six) hours. 30 tablet 0  . sertraline (ZOLOFT) 50 MG tablet Take 50 mg by mouth daily.    . tacrolimus (PROGRAF) 1 MG capsule Take 1 mg by mouth 2 (two) times daily.    . ferrous sulfate 325 (65 FE) MG tablet Take 1 tablet (325 mg total) by mouth daily. (Patient not taking: Reported on 12/31/2017) 30 tablet 1  . prenatal vitamin w/FE, FA (PRENATAL 1 + 1) 27-1 MG TABS Take 1 tablet by mouth daily.       No current facility-administered medications on file prior to visit.     Allergies  Allergen Reactions  . Hydrocodone Itching     Objective:  BP (!) 150/94 (BP Location: Right Arm, Patient  Position: Sitting, Cuff Size: Normal)   Pulse 97   Temp (!) 102.5 F (39.2 C) (Oral)   Resp 18   Ht 5' 3.27" (1.607 m)   Wt 148 lb (67.1 kg)   SpO2 97%   Breastfeeding? No   BMI 26.00 kg/m   Physical Exam  Constitutional: She is oriented to person, place, and time and well-developed, well-nourished, and in no distress. She does not have a sickly appearance. No distress.  HENT:  Right Ear: Tympanic membrane normal.  Left Ear: Tympanic membrane normal.  Nose: Mucosal edema present. No rhinorrhea. Right sinus exhibits no maxillary sinus tenderness and no frontal sinus tenderness. Left sinus exhibits no maxillary sinus tenderness and no frontal sinus tenderness.  Mouth/Throat: Oropharynx is clear and moist and mucous membranes are normal.  Cardiovascular: Normal rate, regular rhythm and normal heart sounds.  Pulmonary/Chest: Effort normal and breath sounds normal. No respiratory distress. She has no wheezes. She has no rales.  Neurological: She is alert and oriented to person, place, and time. GCS score is 15.  Skin: Skin is warm and dry.  Psychiatric: Mood, memory, affect and judgment normal.  Vitals reviewed.   Results for orders placed or performed in visit on 12/31/17  POCT Influenza A/B  Result Value Ref Range   Influenza A, POC Positive (A) Negative   Influenza B, POC Negative Negative  Assessment and Plan :  1. Influenza A virus present - oseltamivir (TAMIFLU) 75 MG capsule; Take 1 capsule (75 mg total) by mouth 2 (two) times daily.  Dispense: 10 capsule; Refill: 0 - Baloxavir Marboxil 40 MG Dose (XOFLUZA) 20 (2) MG TBPK; Take 40 mg by mouth once for 1 dose.  Dispense: 2 each; Refill: 0 - Pt c/o fever and body aches x <48 hours. Tylenol given for fever. Plan to treat. Written rx for tamiflu in the case that [name] is not covered by her insurance. RTC if symptoms do not improve.  2. Generalized body aches - POCT Influenza A/B  3. Fever, unspecified - acetaminophen  (TYLENOL) tablet 1,000 mg   Marco Collie, PA-C  Primary Care at [facility] 12/31/2017 11:40 AM

## 2019-09-08 ENCOUNTER — Other Ambulatory Visit: Payer: Self-pay

## 2019-09-09 ENCOUNTER — Ambulatory Visit (INDEPENDENT_AMBULATORY_CARE_PROVIDER_SITE_OTHER): Payer: BC Managed Care – PPO | Admitting: Family Medicine

## 2019-09-09 ENCOUNTER — Encounter: Payer: Self-pay | Admitting: Family Medicine

## 2019-09-09 VITALS — BP 132/80 | HR 86 | Temp 98.0°F | Ht 63.27 in | Wt 169.0 lb

## 2019-09-09 DIAGNOSIS — Z7689 Persons encountering health services in other specified circumstances: Secondary | ICD-10-CM

## 2019-09-09 DIAGNOSIS — R4589 Other symptoms and signs involving emotional state: Secondary | ICD-10-CM | POA: Diagnosis not present

## 2019-09-09 NOTE — Progress Notes (Signed)
Lauren Mcdonald is a 38 y.o. female  Chief Complaint  Patient presents with  . Establish Care    HPI: Lauren Mcdonald is a 38 y.o. female here to establish care with our office. She was previously with [facility], but more recently seeing her OB-GYN. She has 2 children, married. She has a h/o mild depression, has been off med x 9-10 mo. Doing fine and does not want to restart med.   Specialists: Dr. Algie Coffer (GYN)  Last CPE, labs: 11/2018  Last PAP: 11/2018 - done with GYN, normal as per pt  Med refills needed today: none  Depression screen [facility] 2/9 09/09/2019 12/31/2017  Decreased Interest 0 0  Down, Depressed, Hopeless 0 0  PHQ - 2 Score 0 0      Past Medical History:  Diagnosis Date  . Depression   . Indication for care in labor or delivery 08/11/2015  . No pertinent past medical history   . Postpartum care following vaginal delivery (9/15) 08/11/2015    Past Surgical History:  Procedure Laterality Date  . KNEE ARTHROSCOPY  12/28/2011   Procedure: ARTHROSCOPY KNEE;  Surgeon: Harvie Junior, MD;  Location: [facility];  Service: Orthopedics;  Laterality: Left;  chondroplasty, medial femoral chondial, medial plica excision  . NO PAST SURGERIES    . wisdom teeth extracted      Social History   Socioeconomic History  . Marital status: Married    Spouse name: Trinna Post  . Number of children: 2  . Years of education: Not on file  . Highest education level: Not on file  Occupational History  . Not on file  Social Needs  . Financial resource strain: Not on file  . Food insecurity    Worry: Not on file    Inability: Not on file  . Transportation needs    Medical: Not on file    Non-medical: Not on file  Tobacco Use  . Smoking status: Never Smoker  . Smokeless tobacco: Never Used  Substance and Sexual Activity  . Alcohol use: Yes    Comment: occ  . Drug use: No  . Sexual activity: Yes  Lifestyle  . Physical activity    Days per week:  Not on file    Minutes per session: Not on file  . Stress: Not on file  Relationships  . Social Musician on phone: Not on file    Gets together: Not on file    Attends religious service: Not on file    Active member of club or organization: Not on file    Attends meetings of clubs or organizations: Not on file    Relationship status: Not on file  . Intimate partner violence    Fear of current or ex partner: Not on file    Emotionally abused: Not on file    Physically abused: Not on file    Forced sexual activity: Not on file  Other Topics Concern  . Not on file  Social History Narrative  . Not on file    Family History  Problem Relation Age of Onset  . Cancer Father   . Heart disease Maternal Grandmother   . Stroke Maternal Grandfather      Immunization History  Administered Date(s) Administered  . Influenza Split 08/07/2011  . Influenza,inj,Quad PF,6+ Mos 08/12/2015, 08/26/2019  . Tdap 08/06/2011    Outpatient Encounter Medications as of 09/09/2019  Medication Sig  . [DISCONTINUED] busPIRone (BUSPAR) 7.5 MG  tablet Take 7.5 mg by mouth 2 times daily at 12 noon and 4 pm.  . [DISCONTINUED] cetirizine (ZYRTEC) 10 MG tablet Take 10 mg by mouth daily as needed for allergies.  . [DISCONTINUED] ferrous sulfate 325 (65 FE) MG tablet Take 1 tablet (325 mg total) by mouth daily. (Patient not taking: Reported on 12/31/2017)  . [DISCONTINUED] ibuprofen (ADVIL,MOTRIN) 600 MG tablet Take 1 tablet (600 mg total) by mouth every 6 (six) hours.  . [DISCONTINUED] oseltamivir (TAMIFLU) 75 MG capsule Take 1 capsule (75 mg total) by mouth 2 (two) times daily.  . [DISCONTINUED] prenatal vitamin w/FE, FA (PRENATAL 1 + 1) 27-1 MG TABS Take 1 tablet by mouth daily.    . [DISCONTINUED] sertraline (ZOLOFT) 50 MG tablet Take 50 mg by mouth daily.  . [DISCONTINUED] tacrolimus (PROGRAF) 1 MG capsule Take 1 mg by mouth 2 (two) times daily.   No facility-administered encounter medications on  file as of 09/09/2019.      ROS: Gen: no fever, chills  Skin: no rash, itching ENT: no ear pain, ear drainage, nasal congestion, rhinorrhea, sinus pressure, sore throat Eyes: no blurry vision, double vision Resp: no cough, wheeze,SOB CV: no CP, palpitations, LE edema,  GI: no heartburn, n/v/d/c, abd pain GU: no dysuria, urgency, frequency, hematuria  MSK: no joint pain, myalgias, back pain Neuro: no dizziness, headache, weakness, vertigo Psych: no depression, anxiety, insomnia   Allergies  Allergen Reactions  . Hydrocodone Itching    BP 132/80   Pulse 86   Temp 98 F (36.7 C) (Temporal)   Ht 5' 3.27" (1.607 m)   Wt 169 lb (76.7 kg)   SpO2 100%   BMI 29.68 kg/m   Physical Exam  Constitutional: She is oriented to person, place, and time. She appears well-developed and well-nourished. No distress.  Cardiovascular: Normal rate and regular rhythm.  Pulmonary/Chest: Effort normal and breath sounds normal.  Abdominal: Soft. Bowel sounds are normal. She exhibits no distension.  Neurological: She is alert and oriented to person, place, and time.  Psychiatric: She has a normal mood and affect. Her behavior is normal.     A/P:  1. Encounter to establish care with new doctor - due in 11/2019 for CPE, labs  2. Depressed mood - PHQ = 0 - well-controlled, no issues, no on meds - f/u PRN

## 2020-08-09 ENCOUNTER — Other Ambulatory Visit: Payer: Self-pay

## 2020-08-10 ENCOUNTER — Encounter: Payer: Self-pay | Admitting: Family Medicine

## 2020-08-10 ENCOUNTER — Ambulatory Visit: Payer: BC Managed Care – PPO | Admitting: Family Medicine

## 2020-08-10 VITALS — BP 160/98 | HR 87 | Temp 97.4°F | Ht 63.0 in | Wt 157.6 lb

## 2020-08-10 DIAGNOSIS — Z23 Encounter for immunization: Secondary | ICD-10-CM | POA: Diagnosis not present

## 2020-08-10 DIAGNOSIS — R030 Elevated blood-pressure reading, without diagnosis of hypertension: Secondary | ICD-10-CM

## 2020-08-10 DIAGNOSIS — F411 Generalized anxiety disorder: Secondary | ICD-10-CM | POA: Diagnosis not present

## 2020-08-10 MED ORDER — ESCITALOPRAM OXALATE 10 MG PO TABS: 10.0000 mg | ORAL_TABLET | Freq: Every day | ORAL | 3 refills | Status: DC

## 2020-08-10 NOTE — Addendum Note (Signed)
Addended by: Rene Paci on: 08/10/2020 04:41 PM   Modules accepted: Orders

## 2020-08-10 NOTE — Progress Notes (Signed)
Lauren Mcdonald is a 39 y.o. female  Chief Complaint  Patient presents with  . Follow-up    HTN (150/9-160/94)x 2 weeks and anxiety    HPI: Lauren Mcdonald is a 38 y.o. female who complains of increased anxiety and physical symptoms of this - chest tightness, "generalized anxiousness", and in the past week trouble sleeping, decreased appetite.   No headaches, palpitations, SOB. She has been treated in the past for depression and anxiety with buspar 7.5mg  BID, zoloft 50mg . Pt did feel these were effective. She stopped meds 2 years ago.  She used wellbutrin in the past and states it was "awful".   Past Medical History:  Diagnosis Date  . Depression   . Indication for care in labor or delivery 08/11/2015  . No pertinent past medical history   . Postpartum care following vaginal delivery (9/15) 08/11/2015    Past Surgical History:  Procedure Laterality Date  . KNEE ARTHROSCOPY  12/28/2011   Procedure: ARTHROSCOPY KNEE;  Surgeon: 02/25/2012, MD;  Location: [facility];  Service: Orthopedics;  Laterality: Left;  chondroplasty, medial femoral chondial, medial plica excision  . NO PAST SURGERIES    . wisdom teeth extracted      Social History   Socioeconomic History  . Marital status: Married    Spouse name: Harvie Junior  . Number of children: 2  . Years of education: Not on file  . Highest education level: Not on file  Occupational History  . Not on file  Tobacco Use  . Smoking status: Never Smoker  . Smokeless tobacco: Never Used  Vaping Use  . Vaping Use: Never used  Substance and Sexual Activity  . Alcohol use: Yes    Comment: occ  . Drug use: No  . Sexual activity: Yes  Other Topics Concern  . Not on file  Social History Narrative  . Not on file   Social Determinants of Health   Financial Resource Strain:   . Difficulty of Paying Living Expenses: Not on file  Food Insecurity:   . Worried About Trinna Post in the Last Year: Not on file  . Ran Out  of Food in the Last Year: Not on file  Transportation Needs:   . Lack of Transportation (Medical): Not on file  . Lack of Transportation (Non-Medical): Not on file  Physical Activity:   . Days of Exercise per Week: Not on file  . Minutes of Exercise per Session: Not on file  Stress:   . Feeling of Stress : Not on file  Social Connections:   . Frequency of Communication with Friends and Family: Not on file  . Frequency of Social Gatherings with Friends and Family: Not on file  . Attends Religious Services: Not on file  . Active Member of Clubs or Organizations: Not on file  . Attends Programme researcher, broadcasting/film/video Meetings: Not on file  . Marital Status: Not on file  Intimate Partner Violence:   . Fear of Current or Ex-Partner: Not on file  . Emotionally Abused: Not on file  . Physically Abused: Not on file  . Sexually Abused: Not on file    Family History  Problem Relation Age of Onset  . Cancer Father   . Heart disease Maternal Grandmother   . Stroke Maternal Grandfather      Immunization History  Administered Date(s) Administered  . Influenza Split 08/07/2011  . Influenza,inj,Quad PF,6+ Mos 08/12/2015, 08/26/2019  . PFIZER SARS-COV-2 Vaccination 01/28/2020, 02/19/2020  .  Tdap 08/06/2011    Outpatient Encounter Medications as of 08/10/2020  Medication Sig  . Multiple Vitamin (MULTIVITAMIN ADULT PO) Take by mouth.   No facility-administered encounter medications on file as of 08/10/2020.     ROS: Pertinent positives and negatives noted in HPI. Remainder of ROS non-contributory    Allergies  Allergen Reactions  . Hydrocodone Itching    BP (!) 160/98   Pulse 87   Temp (!) 97.4 F (36.3 C) (Temporal)   Ht 5\' 3"  (1.6 m)   Wt 157 lb 9.6 oz (71.5 kg)   SpO2 99%   BMI 27.92 kg/m    BP Readings from Last 3 Encounters:  08/10/20 (!) 160/98  09/09/19 132/80  12/31/17 (!) 150/94   Pulse Readings from Last 3 Encounters:  08/10/20 87  09/09/19 86  12/31/17 97   Wt  Readings from Last 3 Encounters:  08/10/20 157 lb 9.6 oz (71.5 kg)  09/09/19 169 lb (76.7 kg)  12/31/17 148 lb (67.1 kg)    Physical Exam Constitutional:      General: She is not in acute distress.    Appearance: Normal appearance. She is not ill-appearing.  Cardiovascular:     Rate and Rhythm: Normal rate and regular rhythm.     Pulses: Normal pulses.  Pulmonary:     Effort: No respiratory distress.  Neurological:     Mental Status: She is alert and oriented to person, place, and time.  Psychiatric:        Mood and Affect: Mood normal.        Behavior: Behavior normal.     A/P:  1. Generalized anxiety disorder - on meds in the past Rx: - escitalopram (LEXAPRO) 10 MG tablet; Take 1 tablet (10 mg total) by mouth daily.  Dispense: 90 tablet; Refill: 3 - f/u in 3-4 wks or sooner PRN - VV is ok  2. Elevated blood pressure reading - pt will check at home 3-4x/wk x 2-3 wks and then send MyChart message   This visit occurred during the SARS-CoV-2 public health emergency.  Safety protocols were in place, including screening questions prior to the visit, additional usage of staff PPE, and extensive cleaning of exam room while observing appropriate contact time as indicated for disinfecting solutions.

## 2020-08-22 ENCOUNTER — Encounter: Payer: Self-pay | Admitting: Family Medicine

## 2020-08-22 DIAGNOSIS — F411 Generalized anxiety disorder: Secondary | ICD-10-CM

## 2020-08-23 MED ORDER — BUSPIRONE HCL 7.5 MG PO TABS: 7.5000 mg | ORAL_TABLET | Freq: Two times a day (BID) | ORAL | 3 refills | Status: DC

## 2020-08-23 MED ORDER — SERTRALINE HCL 50 MG PO TABS: ORAL_TABLET | ORAL | 3 refills | Status: DC

## 2020-08-31 ENCOUNTER — Other Ambulatory Visit: Payer: Self-pay

## 2020-09-01 ENCOUNTER — Telehealth (INDEPENDENT_AMBULATORY_CARE_PROVIDER_SITE_OTHER): Payer: BC Managed Care – PPO | Admitting: Family Medicine

## 2020-09-01 ENCOUNTER — Encounter: Payer: Self-pay | Admitting: Family Medicine

## 2020-09-01 ENCOUNTER — Ambulatory Visit: Payer: BC Managed Care – PPO | Admitting: Family Medicine

## 2020-09-01 VITALS — Ht 63.0 in

## 2020-09-01 DIAGNOSIS — R4589 Other symptoms and signs involving emotional state: Secondary | ICD-10-CM | POA: Diagnosis not present

## 2020-09-01 DIAGNOSIS — F411 Generalized anxiety disorder: Secondary | ICD-10-CM

## 2020-09-01 DIAGNOSIS — R030 Elevated blood-pressure reading, without diagnosis of hypertension: Secondary | ICD-10-CM

## 2020-09-01 NOTE — Progress Notes (Signed)
Virtual Visit via Video Note  I connected with Lauren Mcdonald on 09/01/20 at  3:30 PM EDT by a video enabled telemedicine application and verified that I am speaking with the correct person using two identifiers. Location patient: home Location provider: work  Persons participating in the virtual visit: patient, provider  I discussed the limitations of evaluation and management by telemedicine and the availability of in person appointments. The patient expressed understanding and agreed to proceed.  Chief Complaint  Patient presents with  . Follow-up    anxiety/meds.     HPI: Lauren Mcdonald is a 39 y.o. female seen today to f/u on depression/anxiety. Her medications were recently switched from lexapro back to med regimen she had been on in the past - zoloft 50mg  daily and buspar 7.5mg  BID. This change was made around 08/25/20, so about 1 week ago. Pt states she feels "way more back to normal".  No side effects. Sleep is getting better. Appetite is better.    Home BP readings - 128/82 (at GYN), home BPs 140/90's. She is very hesitant about starting BP medication.   Past Medical History:  Diagnosis Date  . Depression   . Indication for care in labor or delivery 08/11/2015  . No pertinent past medical history   . Postpartum care following vaginal delivery (9/15) 08/11/2015    Past Surgical History:  Procedure Laterality Date  . KNEE ARTHROSCOPY  12/28/2011   Procedure: ARTHROSCOPY KNEE;  Surgeon: 02/25/2012, MD;  Location: [facility];  Service: Orthopedics;  Laterality: Left;  chondroplasty, medial femoral chondial, medial plica excision  . NO PAST SURGERIES    . wisdom teeth extracted      Family History  Problem Relation Age of Onset  . Cancer Father   . Heart disease Maternal Grandmother   . Stroke Maternal Grandfather     Social History   Tobacco Use  . Smoking status: Never Smoker  . Smokeless tobacco: Never Used  Vaping Use  . Vaping Use: Never used   Substance Use Topics  . Alcohol use: Yes    Comment: occ  . Drug use: No     Current Outpatient Medications:  .  busPIRone (BUSPAR) 7.5 MG tablet, Take 1 tablet (7.5 mg total) by mouth 2 (two) times daily., Disp: 180 tablet, Rfl: 3 .  Multiple Vitamin (MULTIVITAMIN ADULT PO), Take by mouth., Disp: , Rfl:  .  sertraline (ZOLOFT) 50 MG tablet, 1/2 tab po daily x 1 week then increase to 1 tab po daily, Disp: 90 tablet, Rfl: 3  Allergies  Allergen Reactions  . Hydrocodone Itching      ROS: See pertinent positives and negatives per HPI.   EXAM:  VITALS per patient if applicable: Ht 5\' 3"  (1.6 m)   BMI 27.92 kg/m    GENERAL: alert, oriented, appears well and in no acute distress  NECK: normal movements of the head and neck  LUNGS: on inspection no signs of respiratory distress, breathing rate appears normal, no obvious gross SOB, gasping or wheezing, no conversational dyspnea  CV: no obvious cyanosis  PSYCH/NEURO: pleasant and cooperative, no obvious depression or anxiety, speech and thought processing grossly intact   ASSESSMENT AND PLAN:  1. Generalized anxiety disorder 2. Depressed mood - symptoms much-improved on buspar 7.5mg  BID and zoloft 50mg  daily so pt will continue this  3. Elevated blood pressure reading - home BP readings average 140/90 - discussed medication initiation but pt is hesitant about this. She would like  to work on increasing her CV exercise and dietary improvement. We'll plan to f/u in 2-3 mo. Discussed the potential long term effects of untreated HTN and pt expressed understanding. - discussed with pt low sodium diet and included DASH diet info in AVS    I discussed the assessment and treatment plan with the patient. The patient was provided an opportunity to ask questions and all were answered. The patient agreed with the plan and demonstrated an understanding of the instructions.   The patient was advised to call back or seek an in-person  evaluation if the symptoms worsen or if the condition fails to improve as anticipated.   Luana Shu, DO

## 2020-09-01 NOTE — Patient Instructions (Signed)
DASH Eating Plan DASH stands for "Dietary Approaches to Stop Hypertension." The DASH eating plan is a healthy eating plan that has been shown to reduce high blood pressure (hypertension). It may also reduce your risk for type 2 diabetes, heart disease, and stroke. The DASH eating plan may also help with weight loss. What are tips for following this plan?  General guidelines  Avoid eating more than 2,300 mg (milligrams) of salt (sodium) a day. If you have hypertension, you may need to reduce your sodium intake to 1,500 mg a day.  Limit alcohol intake to no more than 1 drink a day for nonpregnant women and 2 drinks a day for men. One drink equals 12 oz of beer, 5 oz of wine, or 1 oz of hard liquor.  Work with your health care provider to maintain a healthy body weight or to lose weight. Ask what an ideal weight is for you.  Get at least 30 minutes of exercise that causes your heart to beat faster (aerobic exercise) most days of the week. Activities may include walking, swimming, or biking.  Work with your health care provider or diet and nutrition specialist (dietitian) to adjust your eating plan to your individual calorie needs. Reading food labels   Check food labels for the amount of sodium per serving. Choose foods with less than 5 percent of the Daily Value of sodium. Generally, foods with less than 300 mg of sodium per serving fit into this eating plan.  To find whole grains, look for the word "whole" as the first word in the ingredient list. Shopping  Buy products labeled as "low-sodium" or "no salt added."  Buy fresh foods. Avoid canned foods and premade or frozen meals. Cooking  Avoid adding salt when cooking. Use salt-free seasonings or herbs instead of table salt or sea salt. Check with your health care provider or pharmacist before using salt substitutes.  Do not fry foods. Cook foods using healthy methods such as baking, boiling, grilling, and broiling instead.  Cook with  heart-healthy oils, such as olive, canola, soybean, or sunflower oil. Meal planning  Eat a balanced diet that includes: ? 5 or more servings of fruits and vegetables each day. At each meal, try to fill half of your plate with fruits and vegetables. ? Up to 6-8 servings of whole grains each day. ? Less than 6 oz of lean meat, poultry, or fish each day. A 3-oz serving of meat is about the same size as a deck of cards. One egg equals 1 oz. ? 2 servings of low-fat dairy each day. ? A serving of nuts, seeds, or beans 5 times each week. ? Heart-healthy fats. Healthy fats called Omega-3 fatty acids are found in foods such as flaxseeds and coldwater fish, like sardines, salmon, and mackerel.  Limit how much you eat of the following: ? Canned or prepackaged foods. ? Food that is high in trans fat, such as fried foods. ? Food that is high in saturated fat, such as fatty meat. ? Sweets, desserts, sugary drinks, and other foods with added sugar. ? Full-fat dairy products.  Do not salt foods before eating.  Try to eat at least 2 vegetarian meals each week.  Eat more home-cooked food and less restaurant, buffet, and fast food.  When eating at a restaurant, ask that your food be prepared with less salt or no salt, if possible. What foods are recommended? The items listed may not be a complete list. Talk with your dietitian about   what dietary choices are best for you. Grains Whole-grain or whole-wheat bread. Whole-grain or whole-wheat pasta. Brown rice. Oatmeal. Quinoa. Bulgur. Whole-grain and low-sodium cereals. Pita bread. Low-fat, low-sodium crackers. Whole-wheat flour tortillas. Vegetables Fresh or frozen vegetables (raw, steamed, roasted, or grilled). Low-sodium or reduced-sodium tomato and vegetable juice. Low-sodium or reduced-sodium tomato sauce and tomato paste. Low-sodium or reduced-sodium canned vegetables. Fruits All fresh, dried, or frozen fruit. Canned fruit in natural juice (without  added sugar). Meat and other protein foods Skinless chicken or turkey. Ground chicken or turkey. Pork with fat trimmed off. Fish and seafood. Egg whites. Dried beans, peas, or lentils. Unsalted nuts, nut butters, and seeds. Unsalted canned beans. Lean cuts of beef with fat trimmed off. Low-sodium, lean deli meat. Dairy Low-fat (1%) or fat-free (skim) milk. Fat-free, low-fat, or reduced-fat cheeses. Nonfat, low-sodium ricotta or cottage cheese. Low-fat or nonfat yogurt. Low-fat, low-sodium cheese. Fats and oils Soft margarine without trans fats. Vegetable oil. Low-fat, reduced-fat, or light mayonnaise and salad dressings (reduced-sodium). Canola, safflower, olive, soybean, and sunflower oils. Avocado. Seasoning and other foods Herbs. Spices. Seasoning mixes without salt. Unsalted popcorn and pretzels. Fat-free sweets. What foods are not recommended? The items listed may not be a complete list. Talk with your dietitian about what dietary choices are best for you. Grains Baked goods made with fat, such as croissants, muffins, or some breads. Dry pasta or rice meal packs. Vegetables Creamed or fried vegetables. Vegetables in a cheese sauce. Regular canned vegetables (not low-sodium or reduced-sodium). Regular canned tomato sauce and paste (not low-sodium or reduced-sodium). Regular tomato and vegetable juice (not low-sodium or reduced-sodium). Pickles. Olives. Fruits Canned fruit in a light or heavy syrup. Fried fruit. Fruit in cream or butter sauce. Meat and other protein foods Fatty cuts of meat. Ribs. Fried meat. Bacon. Sausage. Bologna and other processed lunch meats. Salami. Fatback. Hotdogs. Bratwurst. Salted nuts and seeds. Canned beans with added salt. Canned or smoked fish. Whole eggs or egg yolks. Chicken or turkey with skin. Dairy Whole or 2% milk, cream, and half-and-half. Whole or full-fat cream cheese. Whole-fat or sweetened yogurt. Full-fat cheese. Nondairy creamers. Whipped toppings.  Processed cheese and cheese spreads. Fats and oils Butter. Stick margarine. Lard. Shortening. Ghee. Bacon fat. Tropical oils, such as coconut, palm kernel, or palm oil. Seasoning and other foods Salted popcorn and pretzels. Onion salt, garlic salt, seasoned salt, table salt, and sea salt. Worcestershire sauce. Tartar sauce. Barbecue sauce. Teriyaki sauce. Soy sauce, including reduced-sodium. Steak sauce. Canned and packaged gravies. Fish sauce. Oyster sauce. Cocktail sauce. Horseradish that you find on the shelf. Ketchup. Mustard. Meat flavorings and tenderizers. Bouillon cubes. Hot sauce and Tabasco sauce. Premade or packaged marinades. Premade or packaged taco seasonings. Relishes. Regular salad dressings. Where to find more information:  National Heart, Lung, and Blood Institute: www.nhlbi.nih.gov  American Heart Association: www.heart.org Summary  The DASH eating plan is a healthy eating plan that has been shown to reduce high blood pressure (hypertension). It may also reduce your risk for type 2 diabetes, heart disease, and stroke.  With the DASH eating plan, you should limit salt (sodium) intake to 2,300 mg a day. If you have hypertension, you may need to reduce your sodium intake to 1,500 mg a day.  When on the DASH eating plan, aim to eat more fresh fruits and vegetables, whole grains, lean proteins, low-fat dairy, and heart-healthy fats.  Work with your health care provider or diet and nutrition specialist (dietitian) to adjust your eating plan to your   individual calorie needs. This information is not intended to replace advice given to you by your health care provider. Make sure you discuss any questions you have with your health care provider. Document Revised: 10/25/2017 Document Reviewed: 11/05/2016 Elsevier Patient Education  2020 Elsevier Inc.  

## 2021-08-03 ENCOUNTER — Ambulatory Visit: Payer: BC Managed Care – PPO | Admitting: Dermatology

## 2021-08-03 ENCOUNTER — Other Ambulatory Visit: Payer: Self-pay

## 2021-08-03 DIAGNOSIS — L409 Psoriasis, unspecified: Secondary | ICD-10-CM

## 2021-08-03 MED ORDER — BETAMETHASONE DIPROPIONATE AUG 0.05 % EX CREA
TOPICAL_CREAM | Freq: Two times a day (BID) | CUTANEOUS | 7 refills | Status: DC
Start: 2021-08-03 — End: 2023-01-17

## 2021-08-12 ENCOUNTER — Encounter: Payer: Self-pay | Admitting: Dermatology

## 2021-08-12 NOTE — Progress Notes (Signed)
   New patient visit   Subjective  Lauren Mcdonald is a 40 y.o. female who presents for the following: Skin Problem (Here to check place on right arm. It does itch. Was seen yeas ago and got a cream to use. Patient needs refill on the cream. Augmented betamethasone.).  Itchy spots, mostly on arms. Location:  Duration:  Quality:  Associated Signs/Symptoms: Modifying Factors:  Severity:  Timing: Context:   Objective  Well appearing patient in no apparent distress; mood and affect are within normal limits. Right Forearm - Posterior Small plaque erythema and scale better fits psoriasis than nummular eczema.    A focused examination was performed including head, neck, arms, legs.. Relevant physical exam findings are noted in the Assessment and Plan.   Assessment & Plan    Psoriasis Right Forearm - Posterior  Patient's request, okay refills on generic Diprolene.  That this should not be used on the face or body folds.  Ideally if she is doing well, she will use it less than daily.  For flares or if she has a stubborn spot she can put up moist cloth on top of the cream for 20 to 30 minutes.  augmented betamethasone dipropionate (DIPROLENE-AF) 0.05 % cream - Right Forearm - Posterior Apply topically 2 (two) times daily.      I, Janalyn Harder, MD, have reviewed all documentation for this visit.  The documentation on 08/12/21 for the exam, diagnosis, procedures, and orders are all accurate and complete.

## 2022-07-17 LAB — HM MAMMOGRAPHY

## 2022-07-25 LAB — HM PAP SMEAR: HPV, high-risk: NEGATIVE

## 2023-01-17 ENCOUNTER — Ambulatory Visit: Payer: BC Managed Care – PPO | Admitting: Nurse Practitioner

## 2023-01-17 ENCOUNTER — Encounter: Payer: Self-pay | Admitting: Nurse Practitioner

## 2023-01-17 VITALS — BP 162/110 | HR 89 | Temp 98.0°F | Ht 63.0 in | Wt 177.0 lb

## 2023-01-17 DIAGNOSIS — I10 Essential (primary) hypertension: Secondary | ICD-10-CM

## 2023-01-17 DIAGNOSIS — F411 Generalized anxiety disorder: Secondary | ICD-10-CM

## 2023-01-17 MED ORDER — LOSARTAN POTASSIUM 25 MG PO TABS: 25.0000 mg | ORAL_TABLET | Freq: Every day | ORAL | 1 refills | Status: DC

## 2023-01-17 NOTE — Patient Instructions (Signed)
It was great to see you!  Start losartan 1 tablet daily for your blood pressure.   Keep checking your blood pressure at home at least twice a week.   Let's follow-up in 3-4 weeks, sooner if you have concerns.  If a referral was placed today, you will be contacted for an appointment. Please note that routine referrals can sometimes take up to 3-4 weeks to process. Please call our office if you haven't heard anything after this time frame.  Take care,  Vance Peper, NP

## 2023-01-17 NOTE — Progress Notes (Signed)
New Patient Visit  BP (!) 162/110 (BP Location: Right Arm, Cuff Size: Normal)   Pulse 89   Temp 98 F (36.7 C)   Ht '5\' 3"'$  (1.6 m)   Wt 177 lb (80.3 kg)   SpO2 98%   BMI 31.35 kg/m    Subjective:    Patient ID: Lauren Mcdonald, female    DOB: Oct 09, 1981, 42 y.o.   MRN: [id number]  CC: Chief Complaint  Patient presents with   Establish Care    NP. Est. Care, concerns with anxiety and elevated BP    HPI: Lauren Mcdonald is a 42 y.o. female presents for new patient visit to establish care.  Introduced to Designer, jewellery role and practice setting.  All questions answered.  Discussed provider/patient relationship and expectations.  She has a history of anxiety since she was a child. In 2015 after the birth of her child, she had postpartum depression and her anxiety would come and go. She was off medication for a while, however her GYN had placed her back on sertraline and buspar. She was having more headaches and stopped taking the medication. She has been off of it for 2 weeks now. She has also tried lexapro, wellbutrin in the past and had side effects to these medication. She saw a therapist in the past, however did not find this helpful.   She has noticed that her blood pressure has been elevated for the past several months. When she checks it, it has been averaging 150s/90s. She does not tend to eat salty foods or add salt to foods. She does not exercise. She denies shortness of breath and chest pain. She will sometimes have tightness in her chest, especially when anxious.      01/17/2023   10:40 AM 09/09/2019    8:08 AM 12/31/2017   11:20 AM  Depression screen PHQ 2/9  Decreased Interest 0 0 0  Down, Depressed, Hopeless 0 0 0  PHQ - 2 Score 0 0 0  Altered sleeping 0    Tired, decreased energy 0    Change in appetite 0    Feeling bad or failure about yourself  0    Trouble concentrating 0    Moving slowly or fidgety/restless 1    Suicidal thoughts 0    PHQ-9 Score 1     Difficult doing work/chores Not difficult at all        01/17/2023   10:40 AM  GAD 7 : Generalized Anxiety Score  Nervous, Anxious, on Edge 0  Control/stop worrying 0  Worry too much - different things 0  Trouble relaxing 0  Restless 1  Easily annoyed or irritable 1  Afraid - awful might happen 0  Total GAD 7 Score 2  Anxiety Difficulty Not difficult at all    Past Medical History:  Diagnosis Date   Anxiety 07/2015   Depression    post partum depression   Indication for care in labor or delivery 08/11/2015   Postpartum care following vaginal delivery (9/15) 08/11/2015   Postpartum hemorrhage 08/13/2015   Rh negative status during pregnancy 08/13/2015    Past Surgical History:  Procedure Laterality Date   KNEE ARTHROSCOPY  12/28/2011   Procedure: ARTHROSCOPY KNEE;  Surgeon: Alta Corning, MD;  Location: [facility];  Service: Orthopedics;  Laterality: Left;  chondroplasty, medial femoral chondial, medial plica excision   wisdom teeth extracted      Family History  Problem Relation Age of Onset   Cancer Father  lung   Heart disease Maternal Grandmother    Stroke Maternal Grandmother      Social History   Tobacco Use   Smoking status: Never   Smokeless tobacco: Never  Vaping Use   Vaping Use: Never used  Substance Use Topics   Alcohol use: Yes    Alcohol/week: 4.0 standard drinks of alcohol    Types: 4 Cans of beer per week    Comment: occ   Drug use: Never    Current Outpatient Medications on File Prior to Visit  Medication Sig Dispense Refill   Multiple Vitamin (MULTIVITAMIN ADULT PO) Take by mouth.     No current facility-administered medications on file prior to visit.     Review of Systems  Constitutional:  Positive for fatigue. Negative for fever.  HENT: Negative.    Eyes:  Positive for visual disturbance. Negative for pain and redness.  Respiratory: Negative.    Cardiovascular: Negative.   Gastrointestinal: Negative.    Genitourinary: Negative.   Musculoskeletal: Negative.   Skin: Negative.   Neurological:  Positive for headaches. Negative for dizziness.  Psychiatric/Behavioral:  The patient is nervous/anxious.       Objective:    BP (!) 162/110 (BP Location: Right Arm, Cuff Size: Normal)   Pulse 89   Temp 98 F (36.7 C)   Ht '5\' 3"'$  (1.6 m)   Wt 177 lb (80.3 kg)   SpO2 98%   BMI 31.35 kg/m   Wt Readings from Last 3 Encounters:  01/17/23 177 lb (80.3 kg)  08/10/20 157 lb 9.6 oz (71.5 kg)  09/09/19 169 lb (76.7 kg)    BP Readings from Last 3 Encounters:  01/17/23 (!) 162/110  08/10/20 (!) 160/98  09/09/19 132/80    Physical Exam     Assessment & Plan:   Problem List Items Addressed This Visit       Cardiovascular and Mediastinum   Primary hypertension - Primary    Chronic, not controlled. Her blood pressure has been elevated for the past several months.  At home her blood pressure has been 150s/90s.  She is anxious being here at the office today and her blood pressure was 162/110.  Will have her start losartan 50 mg daily.  Encourage urged her to limit the amount of salt in her diet and to increase exercise as able.  Continue checking her blood pressure at home.  Follow-up in 3-4 weeks.      Relevant Medications   losartan (COZAAR) 25 MG tablet     Other   Generalized anxiety disorder    Chronic, stable.  Her PHQ-9 is a 1 and her GAD-7 is a 2 today.  She has taken medication and done therapy in the past however she had side effects of the medication and she did not find the therapist helpful.  She feels like overall her anxiety is stable right now.        Follow up plan: Return in about 4 weeks (around 02/14/2023) for 3-4 weeks , HTN.

## 2023-01-18 ENCOUNTER — Encounter: Payer: Self-pay | Admitting: Nurse Practitioner

## 2023-01-18 DIAGNOSIS — I10 Essential (primary) hypertension: Secondary | ICD-10-CM | POA: Insufficient documentation

## 2023-01-18 DIAGNOSIS — F411 Generalized anxiety disorder: Secondary | ICD-10-CM | POA: Insufficient documentation

## 2023-01-18 NOTE — Assessment & Plan Note (Signed)
Chronic, stable.  Her PHQ-9 is a 1 and her GAD-7 is a 2 today.  She has taken medication and done therapy in the past however she had side effects of the medication and she did not find the therapist helpful.  She feels like overall her anxiety is stable right now.

## 2023-01-18 NOTE — Assessment & Plan Note (Addendum)
Chronic, not controlled. Her blood pressure has been elevated for the past several months.  At home her blood pressure has been 150s/90s.  She is anxious being here at the office today and her blood pressure was 162/110.  Will have her start losartan 50 mg daily.  Encourage urged her to limit the amount of salt in her diet and to increase exercise as able.  Continue checking her blood pressure at home.  Follow-up in 3-4 weeks.

## 2023-02-09 ENCOUNTER — Other Ambulatory Visit: Payer: Self-pay | Admitting: Nurse Practitioner

## 2023-02-12 NOTE — Telephone Encounter (Signed)
Last OV and Last Refill 01/17/23 Next OV 02/26/23

## 2023-02-24 NOTE — Progress Notes (Signed)
   Established Patient Office Visit  Subjective   Patient ID: Lauren Mcdonald, female    DOB: July 11, 1981  Age: 42 y.o. MRN: [id number]  No chief complaint on file.   HPI  Lauren Mcdonald is here to follow-up on hypertension.  HYPERTENSION  Hypertension status: {Blank single:19197::"controlled","uncontrolled","better","worse","exacerbated","stable"}  Satisfied with current treatment? {Blank single:19197::"yes","no"} Duration of hypertension: {Blank single:19197::"chronic","months","years"} BP monitoring frequency:  {Blank single:19197::"not checking","rarely","daily","weekly","monthly","a few times a day","a few times a week","a few times a month"} BP range:  BP medication side effects:  {Blank single:19197::"yes","no"} Medication compliance: {Blank single:19197::"excellent compliance","good compliance","fair compliance","poor compliance"} Previous BP meds: losartan Aspirin: {Blank single:19197::"yes","no"} Recurrent headaches: {Blank single:19197::"yes","no"} Visual changes: {Blank single:19197::"yes","no"} Palpitations: {Blank single:19197::"yes","no"} Dyspnea: {Blank single:19197::"yes","no"} Chest pain: {Blank single:19197::"yes","no"} Lower extremity edema: {Blank single:19197::"yes","no"} Dizzy/lightheaded: {Blank single:19197::"yes","no"}   {History (Optional):23778}  ROS    Objective:     There were no vitals taken for this visit. {Vitals History (Optional):23777}  Physical Exam   No results found for any visits on 02/26/23.  {Labs (Optional):23779}  The ASCVD Risk score (Arnett DK, et al., 2019) failed to calculate for the following reasons:   Cannot find a previous HDL lab   Cannot find a previous total cholesterol lab    Assessment & Plan:   Problem List Items Addressed This Visit   None   No follow-ups on file.    Charyl Dancer, NP

## 2023-02-26 ENCOUNTER — Ambulatory Visit: Payer: BC Managed Care – PPO | Admitting: Nurse Practitioner

## 2023-02-26 VITALS — BP 150/100 | HR 59 | Temp 98.7°F | Ht 63.0 in | Wt 179.6 lb

## 2023-02-26 DIAGNOSIS — Z1159 Encounter for screening for other viral diseases: Secondary | ICD-10-CM | POA: Diagnosis not present

## 2023-02-26 DIAGNOSIS — I10 Essential (primary) hypertension: Secondary | ICD-10-CM

## 2023-02-26 LAB — COMPREHENSIVE METABOLIC PANEL
ALT: 14 U/L (ref 0–35)
AST: 19 U/L (ref 0–37)
Albumin: 4.4 g/dL (ref 3.5–5.2)
Alkaline Phosphatase: 67 U/L (ref 39–117)
BUN: 12 mg/dL (ref 6–23)
CO2: 27 mEq/L (ref 19–32)
Calcium: 9.1 mg/dL (ref 8.4–10.5)
Chloride: 104 mEq/L (ref 96–112)
Creatinine, Ser: 0.77 mg/dL (ref 0.40–1.20)
GFR: 95.14 mL/min (ref >60.00)
Glucose, Bld: 91 mg/dL (ref 70–99)
Potassium: 3.9 mEq/L (ref 3.5–5.1)
Sodium: 137 mEq/L (ref 135–145)
Total Bilirubin: 1.3 mg/dL — ABNORMAL HIGH (ref 0.2–1.2)
Total Protein: 6.8 g/dL (ref 6.0–8.3)

## 2023-02-26 LAB — LIPID PANEL
Cholesterol: 172 mg/dL (ref 0–200)
HDL: 59 mg/dL (ref >39.00)
LDL Cholesterol: 102 mg/dL — ABNORMAL HIGH (ref 0–99)
NonHDL: 113.04
Total CHOL/HDL Ratio: 3
Triglycerides: 55 mg/dL (ref 0.0–149.0)
VLDL: 11 mg/dL (ref 0.0–40.0)

## 2023-02-26 LAB — CBC
HCT: 39.5 % (ref 36.0–46.0)
Hemoglobin: 13.2 g/dL (ref 12.0–15.0)
MCHC: 33.3 g/dL (ref 30.0–36.0)
MCV: 86.1 fl (ref 78.0–100.0)
Platelets: 219 10*3/uL (ref 150.0–400.0)
RBC: 4.59 Mil/uL (ref 3.87–5.11)
RDW: 12.6 % (ref 11.5–15.5)
WBC: 5.5 10*3/uL (ref 4.0–10.5)

## 2023-02-26 MED ORDER — LOSARTAN POTASSIUM-HCTZ 50-12.5 MG PO TABS: 1.0000 | ORAL_TABLET | Freq: Every day | ORAL | 1 refills | Status: DC

## 2023-02-26 NOTE — Assessment & Plan Note (Signed)
Chronic, improving.  BP today 150/100.  Will increase losartan to losartan-HCTZ 50-12.5 mg daily.  Check CMP, CBC, lipid panel today.  Follow-up in 3 to 4 weeks. Recommend limiting salt in her diet.

## 2023-02-26 NOTE — Patient Instructions (Addendum)
It was great to see you!  Increase your losartan and start losartan-hctz 1 tablet daily.   Goal is for your blood pressure to be less than 130/90.   We are checking your labs today and will let you know the results via mychart/phone.   Let's follow-up in 4-6 weeks, sooner if you have concerns.  If a referral was placed today, you will be contacted for an appointment. Please note that routine referrals can sometimes take up to 3-4 weeks to process. Please call our office if you haven't heard anything after this time frame.  Take care,  Vance Peper, NP

## 2023-02-27 LAB — HEPATITIS C ANTIBODY: Hepatitis C Ab: NONREACTIVE

## 2023-03-22 ENCOUNTER — Other Ambulatory Visit: Payer: Self-pay | Admitting: Nurse Practitioner

## 2023-03-22 NOTE — Telephone Encounter (Signed)
Requesting: LOSARTAN-HCTZ 50-12.5 MG TAB  Last Visit: 02/26/2023 Next Visit: 04/08/2023 Last Refill: 02/26/2023  Please Advise

## 2023-04-08 ENCOUNTER — Encounter: Payer: Self-pay | Admitting: Nurse Practitioner

## 2023-04-08 ENCOUNTER — Ambulatory Visit: Payer: BC Managed Care – PPO | Admitting: Nurse Practitioner

## 2023-04-08 VITALS — BP 134/80 | HR 63 | Temp 97.1°F | Ht 63.0 in | Wt 177.0 lb

## 2023-04-08 DIAGNOSIS — I10 Essential (primary) hypertension: Secondary | ICD-10-CM | POA: Diagnosis not present

## 2023-04-08 NOTE — Progress Notes (Signed)
   Established Patient Office Visit  Subjective   Patient ID: Lauren Mcdonald, female    DOB: 22-Nov-1981  Age: 42 y.o. MRN: [id number]  Chief Complaint  Patient presents with   Hypertension    Follow up, no concerns   HPI:  Lauren Mcdonald is here to follow-up on hypertension. She is not having any side effects to her medication. She has checked her blood pressure twice at home:   4/30: 140/91 5/10: 142/99  She denies chest pain, shortness of breath and headaches.     ROS See pertinent positives and negatives per HPI.    Objective:     BP 134/80 (BP Location: Left Arm, Cuff Size: Large)   Pulse 63   Temp (!) 97.1 F (36.2 C)   Ht 5\' 3"  (1.6 m)   Wt 177 lb (80.3 kg)   SpO2 99%   BMI 31.35 kg/m  BP Readings from Last 3 Encounters:  04/08/23 134/80  02/26/23 (!) 150/100  01/17/23 (!) 162/110   Wt Readings from Last 3 Encounters:  04/08/23 177 lb (80.3 kg)  02/26/23 179 lb 9.6 oz (81.5 kg)  01/17/23 177 lb (80.3 kg)    Physical Exam Vitals and nursing note reviewed.  Constitutional:      General: She is not in acute distress.    Appearance: Normal appearance.  HENT:     Head: Normocephalic.  Eyes:     Conjunctiva/sclera: Conjunctivae normal.  Cardiovascular:     Rate and Rhythm: Normal rate and regular rhythm.     Pulses: Normal pulses.     Heart sounds: Normal heart sounds.  Pulmonary:     Effort: Pulmonary effort is normal.     Breath sounds: Normal breath sounds.  Musculoskeletal:     Cervical back: Normal range of motion.  Skin:    General: Skin is warm.  Neurological:     General: No focal deficit present.     Mental Status: She is alert and oriented to person, place, and time.  Psychiatric:        Mood and Affect: Mood normal.        Behavior: Behavior normal.        Thought Content: Thought content normal.        Judgment: Judgment normal.    The 10-year ASCVD risk score (Arnett DK, et al., 2019) is: 1.7%    Assessment & Plan:    Problem List Items Addressed This Visit       Cardiovascular and Mediastinum   Primary hypertension - Primary    Chronic, improving. Blood pressure slightly above goal at 134/80. Continue losartan-htcz 50-12.5mg  daily. She has started going to the gym and focusing on her nutrition. Keep checking BP at home. Bring cuff to next visit to verify accuracy. Follow-up in 6 months.        Return in about 6 months (around 10/09/2023) for CPE.    Gerre Scull, NP

## 2023-04-08 NOTE — Assessment & Plan Note (Signed)
Chronic, improving. Blood pressure slightly above goal at 134/80. Continue losartan-htcz 50-12.5mg  daily. She has started going to the gym and focusing on her nutrition. Keep checking BP at home. Bring cuff to next visit to verify accuracy. Follow-up in 6 months.

## 2023-04-08 NOTE — Patient Instructions (Signed)
It was great to see you!  Keep up the great work!   Let's follow-up in 6 months, sooner if you have concerns.  If a referral was placed today, you will be contacted for an appointment. Please note that routine referrals can sometimes take up to 3-4 weeks to process. Please call our office if you haven't heard anything after this time frame.  Take care,  Vendela Troung, NP  

## 2023-05-24 ENCOUNTER — Encounter: Payer: Self-pay | Admitting: Nurse Practitioner

## 2023-06-03 ENCOUNTER — Encounter: Payer: Self-pay | Admitting: Nurse Practitioner

## 2023-06-03 ENCOUNTER — Ambulatory Visit: Payer: BC Managed Care – PPO | Admitting: Nurse Practitioner

## 2023-06-03 VITALS — BP 114/70 | HR 72 | Temp 98.9°F | Ht 63.0 in | Wt 160.8 lb

## 2023-06-03 DIAGNOSIS — F411 Generalized anxiety disorder: Secondary | ICD-10-CM

## 2023-06-03 MED ORDER — BUSPIRONE HCL 5 MG PO TABS: 5.0000 mg | ORAL_TABLET | Freq: Two times a day (BID) | ORAL | 1 refills | Status: DC

## 2023-06-03 MED ORDER — SERTRALINE HCL 50 MG PO TABS: 50.0000 mg | ORAL_TABLET | Freq: Every day | ORAL | 0 refills | Status: DC

## 2023-06-03 NOTE — Patient Instructions (Signed)
It was great to see you!  Start sertraline 1/2 tablet daily for 2 weeks then increase to 1 tablet daily.   Start buspar 1 tablet twice a day as needed for anxiety.   Let's follow-up in 4-6 weeks, sooner if you have concerns.  If a referral was placed today, you will be contacted for an appointment. Please note that routine referrals can sometimes take up to 3-4 weeks to process. Please call our office if you haven't heard anything after this time frame.  Take care,  Rodman Pickle, NP

## 2023-06-03 NOTE — Progress Notes (Signed)
Established Patient Office Visit  Subjective   Patient ID: Lauren Mcdonald, female    DOB: [birth year]  Age: 42 y.o. MRN: [id number]  Chief Complaint  Patient presents with   Anxiety    Discuss medication options   HPI:  Lauren Mcdonald is here to follow-up on anxiety. She states that her anxiety has worsened over the past few months.  She states that she is having a hard time sleeping and having some changes in her appetite.  She states that she was taking sertraline and BuSpar in the past which worked well together.  She had also tried Lexapro and Wellbutrin however these did not help.  She states that she has had some things going on in her personal life which is contributing to her anxiety some.  She has done therapy in the past, however did not find it beneficial.  She denies SI/HI and panic attacks.     06/03/2023    4:45 PM 01/17/2023   10:40 AM 09/09/2019    8:08 AM 12/31/2017   11:20 AM  Depression screen PHQ 2/9  Decreased Interest 1 0 0 0  Down, Depressed, Hopeless 2 0 0 0  PHQ - 2 Score 3 0 0 0  Altered sleeping 2 0    Tired, decreased energy 2 0    Change in appetite 2 0    Feeling bad or failure about yourself  0 0    Trouble concentrating 1 0    Moving slowly or fidgety/restless 0 1    Suicidal thoughts 1 0    PHQ-9 Score 11 1    Difficult doing work/chores Somewhat difficult Not difficult at all        06/03/2023    4:45 PM 01/17/2023   10:40 AM  GAD 7 : Generalized Anxiety Score  Nervous, Anxious, on Edge 2 0  Control/stop worrying 2 0  Worry too much - different things 1 0  Trouble relaxing 2 0  Restless 0 1  Easily annoyed or irritable 0 1  Afraid - awful might happen 2 0  Total GAD 7 Score 9 2  Anxiety Difficulty Somewhat difficult Not difficult at all      ROS See pertinent positives and negatives per HPI.    Objective:     BP 114/70 (BP Location: Left Arm)   Pulse 72   Temp 98.9 F (37.2 C)   Ht 5\' 3"  (1.6 m)   Wt 160 lb 12.8 oz (72.9 kg)    SpO2 98%   BMI 28.48 kg/m    Physical Exam Vitals and nursing note reviewed.  Constitutional:      General: She is not in acute distress.    Appearance: Normal appearance.  HENT:     Head: Normocephalic.  Eyes:     Conjunctiva/sclera: Conjunctivae normal.  Cardiovascular:     Rate and Rhythm: Normal rate and regular rhythm.     Pulses: Normal pulses.     Heart sounds: Normal heart sounds.  Pulmonary:     Effort: Pulmonary effort is normal.     Breath sounds: Normal breath sounds.  Musculoskeletal:     Cervical back: Normal range of motion.  Skin:    General: Skin is warm.  Neurological:     General: No focal deficit present.     Mental Status: She is alert and oriented to person, place, and time.  Psychiatric:        Mood and Affect: Mood normal.  Behavior: Behavior normal.        Thought Content: Thought content normal.        Judgment: Judgment normal.    The 10-year ASCVD risk score (Arnett DK, et al., 2019) is: 0.7%    Assessment & Plan:   Problem List Items Addressed This Visit       Other   Generalized anxiety disorder - Primary    Chronic, not controlled.  Her anxiety has slightly worsened over the last few months.  She states that she is having some trouble sleeping and changes in her appetite.  She denies SI/HI and panic attacks.  Her PHQ 9 is an 11 and her GAD 7 is a 9. Will have her start sertraline 25 mg daily for 2 weeks then increase to 50 mg daily and BuSpar 5 mg twice a day.  Follow-up in 4 to 6 weeks.      Relevant Medications   sertraline (ZOLOFT) 50 MG tablet   busPIRone (BUSPAR) 5 MG tablet    Return in about 4 weeks (around 07/01/2023) for 4-6 weeks , Anxiety, may be virtual .    Gerre Scull, NP

## 2023-06-03 NOTE — Assessment & Plan Note (Addendum)
Chronic, not controlled.  Her anxiety has slightly worsened over the last few months.  She states that she is having some trouble sleeping and changes in her appetite.  She denies SI/HI and panic attacks.  Her PHQ 9 is an 11 and her GAD 7 is a 9. Will have her start sertraline 25 mg daily for 2 weeks then increase to 50 mg daily and BuSpar 5 mg twice a day.  Follow-up in 4 to 6 weeks.

## 2023-06-17 ENCOUNTER — Ambulatory Visit: Payer: BC Managed Care – PPO | Admitting: Nurse Practitioner

## 2023-06-26 ENCOUNTER — Other Ambulatory Visit: Payer: Self-pay | Admitting: Nurse Practitioner

## 2023-08-30 ENCOUNTER — Other Ambulatory Visit: Payer: Self-pay | Admitting: Nurse Practitioner

## 2023-09-03 ENCOUNTER — Other Ambulatory Visit: Payer: Self-pay | Admitting: Nurse Practitioner

## 2023-09-03 NOTE — Telephone Encounter (Signed)
Requesting: SERTRALINE HCL 50 MG TABLET  Last Visit: 06/03/2023 Next Visit: 10/09/2023 Last Refill: 06/03/2023  Please Advise

## 2023-09-04 ENCOUNTER — Ambulatory Visit: Payer: BC Managed Care – PPO | Admitting: Nurse Practitioner

## 2023-09-04 ENCOUNTER — Encounter: Payer: Self-pay | Admitting: Nurse Practitioner

## 2023-09-04 VITALS — BP 130/82 | HR 70 | Temp 96.9°F | Ht 63.0 in | Wt 161.4 lb

## 2023-09-04 DIAGNOSIS — B354 Tinea corporis: Secondary | ICD-10-CM | POA: Diagnosis not present

## 2023-09-04 DIAGNOSIS — Z23 Encounter for immunization: Secondary | ICD-10-CM

## 2023-09-04 MED ORDER — KETOCONAZOLE 2 % EX CREA: 1.0000 | TOPICAL_CREAM | Freq: Every day | CUTANEOUS | 0 refills | Status: DC

## 2023-09-04 NOTE — Progress Notes (Signed)
   Acute Office Visit  Subjective:     Patient ID: Lauren Mcdonald, female    DOB: 08-22-1981, 42 y.o.   MRN: [id number]  Chief Complaint  Patient presents with   Rash    Rash on right collar bone, appeared yesterday    Rash   Patient is in today for rash on her right collar bone that started yesterday.  RASH  Duration: 1 days  Location:  collar bone   Itching: yes Burning: no Redness: yes Oozing: no Scaling: no Blisters: no Painful: no Fevers: no Change in detergents/soaps/personal care products: no Recent illness: no Recent travel:no History of same: no Context: stable Alleviating factors: nothing Treatments attempted:nothing Shortness of breath: no  Throat/tongue swelling: no Myalgias/arthralgias: no  Review of Systems  Skin:  Positive for rash.   See pertinent positives and negatives per HPI.     Objective:    BP 130/82   Pulse 70   Temp (!) 96.9 F (36.1 C) (Temporal)   Ht 5\' 3"  (1.6 m)   Wt 161 lb 6.4 oz (73.2 kg)   SpO2 100%   BMI 28.59 kg/m    Physical Exam Vitals and nursing note reviewed.  Constitutional:      General: She is not in acute distress.    Appearance: Normal appearance.  HENT:     Head: Normocephalic.  Eyes:     Conjunctiva/sclera: Conjunctivae normal.  Pulmonary:     Effort: Pulmonary effort is normal.  Musculoskeletal:     Cervical back: Normal range of motion.  Skin:    General: Skin is warm.     Findings: Rash (red, circular area with central clearing) present.  Neurological:     General: No focal deficit present.     Mental Status: She is alert and oriented to person, place, and time.  Psychiatric:        Mood and Affect: Mood normal.        Behavior: Behavior normal.        Thought Content: Thought content normal.        Judgment: Judgment normal.       Assessment & Plan:   Problem List Items Addressed This Visit   None Visit Diagnoses     Tinea corporis    -  Primary   Start ketoconazole cream  daily. Keep covered if out in public. Can be contagious. Follow-up if not improving.   Relevant Medications   ketoconazole (NIZORAL) 2 % cream   Immunization due       Flu vaccine given today   Relevant Orders   Flu vaccine trivalent PF, 6mos and older(Flulaval,Afluria,Fluarix,Fluzone)       Meds ordered this encounter  Medications   ketoconazole (NIZORAL) 2 % cream    Sig: Apply 1 Application topically daily.    Dispense:  15 g    Refill:  0    Return if symptoms worsen or fail to improve.  Gerre Scull, NP

## 2023-09-04 NOTE — Patient Instructions (Signed)
It was great to see you!  Start ketaconazole cream daily to your rash. It is ok to keep it covered.   Let's follow-up if symptoms worsen or don't improve.  Take care,  Rodman Pickle, NP

## 2023-10-09 ENCOUNTER — Ambulatory Visit: Payer: BC Managed Care – PPO | Admitting: Nurse Practitioner

## 2023-10-09 ENCOUNTER — Other Ambulatory Visit: Payer: Self-pay | Admitting: Nurse Practitioner

## 2023-10-09 ENCOUNTER — Encounter: Payer: Self-pay | Admitting: Nurse Practitioner

## 2023-10-09 VITALS — BP 132/88 | HR 75 | Temp 97.5°F | Ht 63.0 in | Wt 162.0 lb

## 2023-10-09 DIAGNOSIS — Z0000 Encounter for general adult medical examination without abnormal findings: Secondary | ICD-10-CM

## 2023-10-09 DIAGNOSIS — F411 Generalized anxiety disorder: Secondary | ICD-10-CM

## 2023-10-09 DIAGNOSIS — R61 Generalized hyperhidrosis: Secondary | ICD-10-CM

## 2023-10-09 DIAGNOSIS — R5383 Other fatigue: Secondary | ICD-10-CM

## 2023-10-09 DIAGNOSIS — I10 Essential (primary) hypertension: Secondary | ICD-10-CM

## 2023-10-09 LAB — COMPREHENSIVE METABOLIC PANEL
ALT: 17 U/L (ref 0–35)
AST: 27 U/L (ref 0–37)
Albumin: 4.3 g/dL (ref 3.5–5.2)
Alkaline Phosphatase: 69 U/L (ref 39–117)
BUN: 10 mg/dL (ref 6–23)
CO2: 30 meq/L (ref 19–32)
Calcium: 9.1 mg/dL (ref 8.4–10.5)
Chloride: 101 meq/L (ref 96–112)
Creatinine, Ser: 0.78 mg/dL (ref 0.40–1.20)
GFR: 93.28 mL/min (ref >60.00)
Glucose, Bld: 91 mg/dL (ref 70–99)
Potassium: 4.1 meq/L (ref 3.5–5.1)
Sodium: 137 meq/L (ref 135–145)
Total Bilirubin: 1.5 mg/dL — ABNORMAL HIGH (ref 0.2–1.2)
Total Protein: 6.7 g/dL (ref 6.0–8.3)

## 2023-10-09 LAB — LIPID PANEL
Cholesterol: 163 mg/dL (ref 0–200)
HDL: 61 mg/dL (ref >39.00)
LDL Cholesterol: 94 mg/dL (ref 0–99)
NonHDL: 101.65
Total CHOL/HDL Ratio: 3
Triglycerides: 40 mg/dL (ref 0.0–149.0)
VLDL: 8 mg/dL (ref 0.0–40.0)

## 2023-10-09 LAB — CBC WITH DIFFERENTIAL/PLATELET
Basophils Absolute: 0 10*3/uL (ref 0.0–0.1)
Basophils Relative: 0.6 % (ref 0.0–3.0)
Eosinophils Absolute: 0 10*3/uL (ref 0.0–0.7)
Eosinophils Relative: 0.4 % (ref 0.0–5.0)
HCT: 41.5 % (ref 36.0–46.0)
Hemoglobin: 13.2 g/dL (ref 12.0–15.0)
Lymphocytes Relative: 25.6 % (ref 12.0–46.0)
Lymphs Abs: 1.4 10*3/uL (ref 0.7–4.0)
MCHC: 31.9 g/dL (ref 30.0–36.0)
MCV: 89.5 fL (ref 78.0–100.0)
Monocytes Absolute: 0.3 10*3/uL (ref 0.1–1.0)
Monocytes Relative: 5.2 % (ref 3.0–12.0)
Neutro Abs: 3.7 10*3/uL (ref 1.4–7.7)
Neutrophils Relative %: 68.2 % (ref 43.0–77.0)
Platelets: 241 10*3/uL (ref 150.0–400.0)
RBC: 4.64 Mil/uL (ref 3.87–5.11)
RDW: 12.4 % (ref 11.5–15.5)
WBC: 5.5 10*3/uL (ref 4.0–10.5)

## 2023-10-09 LAB — VITAMIN B12: Vitamin B-12: 294 pg/mL (ref 211–911)

## 2023-10-09 LAB — TSH: TSH: 0.92 u[IU]/mL (ref 0.35–5.50)

## 2023-10-09 LAB — VITAMIN D 25 HYDROXY (VIT D DEFICIENCY, FRACTURES): VITD: 20.24 ng/mL — ABNORMAL LOW (ref 30.00–100.00)

## 2023-10-09 NOTE — Assessment & Plan Note (Signed)
Chronic, stable. Continue buspar 5mg  BID and zoloft 50mg  daily. Follow-up in 1 year or sooner with worsening symptoms.

## 2023-10-09 NOTE — Progress Notes (Signed)
BP 132/88 (BP Location: Left Arm)   Pulse 75   Temp (!) 97.5 F (36.4 C)   Ht 5\' 3"  (1.6 m)   Wt 162 lb (73.5 kg)   SpO2 99%   BMI 28.70 kg/m    Subjective:    Patient ID: Lauren Mcdonald, female    DOB: [birth year], 42 y.o.   MRN: [id number]  CC: Chief Complaint  Patient presents with   Annual Exam    With lab work, fatigue a lot, cold sweats at night, bruises easily    HPI: Lauren Mcdonald is a 42 y.o. female presenting on 10/09/2023 for comprehensive medical examination. Current medical complaints include: fatigue, night sweats, bruising easily  Discussed the use of AI scribe software for clinical note transcription with the patient, who gave verbal consent to proceed.  History of Present Illness   The patient presents for annual visit with recent onset of night sweats and fatigue. She reports waking up drenched in sweat to the point of needing to change clothes. She is unsure if the fatigue is due to disrupted sleep from the night sweats or another cause. She has also noticed an increase in unexplained bruising. The patient has an IUD and does not have regular periods. She denies cough, fever, chest pain, falls, stomach issues, and urinary symptoms. She has been trying to get back into a routine of exercising and regularly sees a dentist.      Menopausal Symptoms: yes - hot flashes, fatigue  Depression and Anxiety Screen done today and results listed below:     09/04/2023    1:40 PM 06/03/2023    4:45 PM 01/17/2023   10:40 AM 09/09/2019    8:08 AM 12/31/2017   11:20 AM  Depression screen PHQ 2/9  Decreased Interest 0 1 0 0 0  Down, Depressed, Hopeless 0 2 0 0 0  PHQ - 2 Score 0 3 0 0 0  Altered sleeping 0 2 0    Tired, decreased energy 0 2 0    Change in appetite 0 2 0    Feeling bad or failure about yourself  0 0 0    Trouble concentrating 0 1 0    Moving slowly or fidgety/restless 0 0 1    Suicidal thoughts 0 1 0    PHQ-9 Score 0 11 1    Difficult doing work/chores  Not difficult at all Somewhat difficult Not difficult at all        09/04/2023    1:41 PM 06/03/2023    4:45 PM 01/17/2023   10:40 AM  GAD 7 : Generalized Anxiety Score  Nervous, Anxious, on Edge 0 2 0  Control/stop worrying 0 2 0  Worry too much - different things 0 1 0  Trouble relaxing 0 2 0  Restless 0 0 1  Easily annoyed or irritable 0 0 1  Afraid - awful might happen 0 2 0  Total GAD 7 Score 0 9 2  Anxiety Difficulty Not difficult at all Somewhat difficult Not difficult at all    The patient does not have a history of falls. I did not complete a risk assessment for falls. A plan of care for falls was not documented.   Past Medical History:  Past Medical History:  Diagnosis Date   Anxiety 07/2015   Depression    post partum depression   Indication for care in labor or delivery 08/11/2015   Postpartum care following vaginal delivery (9/15) 08/11/2015   Postpartum hemorrhage  08/13/2015   Rh negative status during pregnancy 08/13/2015    Surgical History:  Past Surgical History:  Procedure Laterality Date   KNEE ARTHROSCOPY  12/28/2011   Procedure: ARTHROSCOPY KNEE;  Surgeon: Harvie Junior, MD;  Location: [facility];  Service: Orthopedics;  Laterality: Left;  chondroplasty, medial femoral chondial, medial plica excision   wisdom teeth extracted      Medications:  Current Outpatient Medications on File Prior to Visit  Medication Sig   busPIRone (BUSPAR) 5 MG tablet TAKE 1 TABLET BY MOUTH TWICE A DAY   ketoconazole (NIZORAL) 2 % cream Apply 1 Application topically daily.   levonorgestrel (MIRENA) 20 MCG/DAY IUD 1 each by Intrauterine route once.   losartan-hydrochlorothiazide (HYZAAR) 50-12.5 MG tablet TAKE 1 TABLET BY MOUTH EVERY DAY   Multiple Vitamin (MULTIVITAMIN ADULT PO) Take by mouth.   sertraline (ZOLOFT) 50 MG tablet TAKE 1/2 TABLET BY MOUTH DAILY FOR 2 WEEKS THEN INCREASE TO 1 TABLET DAILY   No current facility-administered medications on file  prior to visit.    Allergies:  Allergies  Allergen Reactions   Hydrocodone Itching    Social History:  Social History   Socioeconomic History   Marital status: Married    Spouse name: Alex   Number of children: 2   Years of education: Not on file   Highest education level: Master's degree (e.g., MA, MS, MEng, MEd, MSW, MBA)  Occupational History   Not on file  Tobacco Use   Smoking status: Never   Smokeless tobacco: Never  Vaping Use   Vaping status: Never Used  Substance and Sexual Activity   Alcohol use: Yes    Alcohol/week: 4.0 standard drinks of alcohol    Types: 4 Cans of beer per week    Comment: occ   Drug use: Never   Sexual activity: Yes    Birth control/protection: I.U.D.    Comment: placed 2015  Other Topics Concern   Not on file  Social History Narrative   Not on file   Social Determinants of Health   Financial Resource Strain: Low Risk  (02/26/2023)   Overall Financial Resource Strain (CARDIA)    Difficulty of Paying Living Expenses: Not hard at all  Food Insecurity: No Food Insecurity (02/26/2023)   Hunger Vital Sign    Worried About Running Out of Food in the Last Year: Never true    Ran Out of Food in the Last Year: Never true  Transportation Needs: No Transportation Needs (02/26/2023)   PRAPARE - Administrator, Civil Service (Medical): No    Lack of Transportation (Non-Medical): No  Physical Activity: Insufficiently Active (02/26/2023)   Exercise Vital Sign    Days of Exercise per Week: 1 day    Minutes of Exercise per Session: 30 min  Stress: No Stress Concern Present (02/26/2023)   [employer] of Occupational Health - Occupational Stress Questionnaire    Feeling of Stress : Only a little  Social Connections: Moderately Integrated (02/26/2023)   Social Connection and Isolation Panel [NHANES]    Frequency of Communication with Friends and Family: Three times a week    Frequency of Social Gatherings with Friends and Family: Once a  week    Attends Religious Services: More than 4 times per year    Active Member of [employer] or Organizations: No    Attends Engineer, structural: Not on file    Marital Status: Married  Catering manager Violence: Not on file  Social History   Tobacco Use  Smoking Status Never  Smokeless Tobacco Never   Social History   Substance and Sexual Activity  Alcohol Use Yes   Alcohol/week: 4.0 standard drinks of alcohol   Types: 4 Cans of beer per week   Comment: occ    Family History:  Family History  Problem Relation Age of Onset   Cancer Father        lung   Heart disease Maternal Grandmother    Stroke Maternal Grandmother     Past medical history, surgical history, medications, allergies, family history and social history reviewed with patient today and changes made to appropriate areas of the chart.   Review of Systems  Constitutional:  Positive for diaphoresis and malaise/fatigue. Negative for fever.  HENT: Negative.    Eyes: Negative.   Respiratory: Negative.    Cardiovascular: Negative.   Gastrointestinal: Negative.   Genitourinary: Negative.   Musculoskeletal: Negative.   Skin: Negative.   Neurological: Negative.   Psychiatric/Behavioral: Negative.     All other ROS negative except what is listed above and in the HPI.      Objective:    BP 132/88 (BP Location: Left Arm)   Pulse 75   Temp (!) 97.5 F (36.4 C)   Ht 5\' 3"  (1.6 m)   Wt 162 lb (73.5 kg)   SpO2 99%   BMI 28.70 kg/m   Wt Readings from Last 3 Encounters:  10/09/23 162 lb (73.5 kg)  09/04/23 161 lb 6.4 oz (73.2 kg)  06/03/23 160 lb 12.8 oz (72.9 kg)    Physical Exam Vitals and nursing note reviewed.  Constitutional:      General: She is not in acute distress.    Appearance: Normal appearance.  HENT:     Head: Normocephalic and atraumatic.     Right Ear: Tympanic membrane, ear canal and external ear normal.     Left Ear: Tympanic membrane, ear canal and external ear normal.   Eyes:     Conjunctiva/sclera: Conjunctivae normal.  Cardiovascular:     Rate and Rhythm: Normal rate and regular rhythm.     Pulses: Normal pulses.     Heart sounds: Normal heart sounds.  Pulmonary:     Effort: Pulmonary effort is normal.     Breath sounds: Normal breath sounds.  Abdominal:     Palpations: Abdomen is soft.     Tenderness: There is no abdominal tenderness.  Musculoskeletal:        General: Normal range of motion.     Cervical back: Normal range of motion and neck supple.     Right lower leg: No edema.     Left lower leg: No edema.  Lymphadenopathy:     Cervical: No cervical adenopathy.  Skin:    General: Skin is warm and dry.  Neurological:     General: No focal deficit present.     Mental Status: She is alert and oriented to person, place, and time.     Cranial Nerves: No cranial nerve deficit.     Coordination: Coordination normal.     Gait: Gait normal.  Psychiatric:        Mood and Affect: Mood normal.        Behavior: Behavior normal.        Thought Content: Thought content normal.        Judgment: Judgment normal.     Results for orders placed or performed in visit on 02/26/23  CBC  Result Value  Ref Range   WBC 5.5 4.0 - 10.5 K/uL   RBC 4.59 3.87 - 5.11 Mil/uL   Platelets 219.0 150.0 - 400.0 K/uL   Hemoglobin 13.2 12.0 - 15.0 g/dL   HCT 25.3 66.4 - 40.3 %   MCV 86.1 78.0 - 100.0 fl   MCHC 33.3 30.0 - 36.0 g/dL   RDW 47.4 25.9 - 56.3 %  Comprehensive metabolic panel  Result Value Ref Range   Sodium 137 135 - 145 mEq/L   Potassium 3.9 3.5 - 5.1 mEq/L   Chloride 104 96 - 112 mEq/L   CO2 27 19 - 32 mEq/L   Glucose, Bld 91 70 - 99 mg/dL   BUN 12 6 - 23 mg/dL   Creatinine, Ser 8.75 0.40 - 1.20 mg/dL   Total Bilirubin 1.3 (H) 0.2 - 1.2 mg/dL   Alkaline Phosphatase 67 39 - 117 U/L   AST 19 0 - 37 U/L   ALT 14 0 - 35 U/L   Total Protein 6.8 6.0 - 8.3 g/dL   Albumin 4.4 3.5 - 5.2 g/dL   GFR 64.33 >29.51 mL/min   Calcium 9.1 8.4 - 10.5 mg/dL   Lipid panel  Result Value Ref Range   Cholesterol 172 0 - 200 mg/dL   Triglycerides 88.4 0.0 - 149.0 mg/dL   HDL 16.60 >63.01 mg/dL   VLDL 60.1 0.0 - 09.3 mg/dL   LDL Cholesterol 235 (H) 0 - 99 mg/dL   Total CHOL/HDL Ratio 3    NonHDL 113.04   Hepatitis C antibody  Result Value Ref Range   Hepatitis C Ab NON-REACTIVE NON-REACTIVE      Assessment & Plan:   Problem List Items Addressed This Visit       Cardiovascular and Mediastinum   Primary hypertension    Chronic, stable. Continue losartan-htcz 50-12.5mg  daily. Check CMP, CBC, lipid panel today. Follow-up 1 year.       Relevant Orders   CBC with Differential/Platelet   Comprehensive metabolic panel   Lipid panel     Other   Generalized anxiety disorder    Chronic, stable. Continue buspar 5mg  BID and zoloft 50mg  daily. Follow-up in 1 year or sooner with worsening symptoms.       Routine general medical examination at a health care facility - Primary   Night sweats    Recent onset of night sweats has led to sleep disturbance and subsequent fatigue for her, with no associated fever, cough, or chest pain. Considering her age and symptoms, perimenopause could be a potential cause. We will order labs to rule out anemia, thyroid disorder, or other systemic causes and consider perimenopause based on age and symptoms.       Relevant Orders   CBC with Differential/Platelet   Comprehensive metabolic panel   TSH   VITAMIN D 25 Hydroxy (Vit-D Deficiency, Fractures)   Vitamin B12   Other Visit Diagnoses     Fatigue, unspecified type       Check CMP, CBC, TSH, vitmain B12 and D. could be related to night sweats waking her up at night and perimenopause.   Relevant Orders   CBC with Differential/Platelet   Comprehensive metabolic panel   TSH   VITAMIN D 25 Hydroxy (Vit-D Deficiency, Fractures)   Vitamin B12        Follow up plan: Return in about 1 year (around 10/08/2024) for CPE.   LABORATORY TESTING:  - Pap smear:  up to date  IMMUNIZATIONS:   - Tdap: Tetanus vaccination status  reviewed: last tetanus booster within 10 years. - Influenza: Up to date - Pneumovax: Not applicable - Prevnar: Not applicable - HPV: Not applicable - Shingrix vaccine: Not applicable  SCREENING: -Mammogram:  scheduled Friday   - Colonoscopy: Not applicable  - Bone Density: Not applicable   PATIENT COUNSELING:   Advised to take 1 mg of folate supplement per day if capable of pregnancy.   Sexuality: Discussed sexually transmitted diseases, partner selection, use of condoms, avoidance of unintended pregnancy  and contraceptive alternatives.   Advised to avoid cigarette smoking.  I discussed with the patient that most people either abstain from alcohol or drink within safe limits (<=14/week and <=4 drinks/occasion for males, <=7/weeks and <= 3 drinks/occasion for females) and that the risk for alcohol disorders and other health effects rises proportionally with the number of drinks per week and how often a drinker exceeds daily limits.  Discussed cessation/primary prevention of drug use and availability of treatment for abuse.   Diet: Encouraged to adjust caloric intake to maintain  or achieve ideal body weight, to reduce intake of dietary saturated fat and total fat, to limit sodium intake by avoiding high sodium foods and not adding table salt, and to maintain adequate dietary potassium and calcium preferably from fresh fruits, vegetables, and low-fat dairy products.    stressed the importance of regular exercise  Injury prevention: Discussed safety belts, safety helmets, smoke detector, smoking near bedding or upholstery.   Dental health: Discussed importance of regular tooth brushing, flossing, and dental visits.    NEXT PREVENTATIVE PHYSICAL DUE IN 1 YEAR. Return in about 1 year (around 10/08/2024) for CPE.  Amore Ackman A Braydn Carneiro

## 2023-10-09 NOTE — Telephone Encounter (Signed)
Requesting: LOSARTAN-HCTZ 50-12.5 MG TAB  Last Visit: 09/04/2023 Next Visit: 10/09/2023 Last Refill: 03/22/2023  Please Advise

## 2023-10-09 NOTE — Patient Instructions (Signed)
It was great to see you!  We are checking your labs today and will let you know the results via mychart/phone.   Let's follow-up in 1 year, sooner if you have concerns.  If a referral was placed today, you will be contacted for an appointment. Please note that routine referrals can sometimes take up to 3-4 weeks to process. Please call our office if you haven't heard anything after this time frame.  Take care,  Naina Sleeper, NP  

## 2023-10-09 NOTE — Assessment & Plan Note (Signed)
Chronic, stable. Continue losartan-htcz 50-12.5mg  daily. Check CMP, CBC, lipid panel today. Follow-up 1 year.

## 2023-10-09 NOTE — Assessment & Plan Note (Signed)
Recent onset of night sweats has led to sleep disturbance and subsequent fatigue for her, with no associated fever, cough, or chest pain. Considering her age and symptoms, perimenopause could be a potential cause. We will order labs to rule out anemia, thyroid disorder, or other systemic causes and consider perimenopause based on age and symptoms.

## 2024-03-29 ENCOUNTER — Other Ambulatory Visit: Payer: Self-pay | Admitting: Nurse Practitioner

## 2024-03-30 NOTE — Telephone Encounter (Signed)
 Requesting: LOSARTAN -HCTZ 50-12.5 MG TAB  Last Visit: 10/09/2023 Next Visit: Visit date not found Last Refill: 10/09/2023  Please Advise

## 2024-05-28 ENCOUNTER — Other Ambulatory Visit: Payer: Self-pay | Admitting: Nurse Practitioner

## 2024-12-26 ENCOUNTER — Other Ambulatory Visit: Payer: Self-pay | Admitting: Nurse Practitioner
